# Patient Record
Sex: Male | Born: 2000 | Race: White | Hispanic: No | Marital: Single | Smoking: Never smoker
Health system: Southern US, Community
[De-identification: ages and names within clinical notes are randomized; demographics above are authoritative.]

## PROBLEM LIST (undated history)

## (undated) DIAGNOSIS — F909 Attention-deficit hyperactivity disorder, unspecified type: Secondary | ICD-10-CM

## (undated) HISTORY — DX: Attention-deficit hyperactivity disorder, unspecified type: F90.9

## (undated) HISTORY — PX: CIRCUMCISION: SUR203

---
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
## 2020-06-02 ENCOUNTER — Other Ambulatory Visit: Payer: Self-pay | Admitting: *Deleted

## 2020-06-02 ENCOUNTER — Encounter: Payer: Self-pay | Admitting: Family Medicine

## 2020-06-02 ENCOUNTER — Encounter: Payer: Self-pay | Admitting: *Deleted

## 2020-06-07 ENCOUNTER — Telehealth: Payer: Self-pay

## 2020-06-07 NOTE — Telephone Encounter (Signed)
Please advise 

## 2020-06-07 NOTE — Telephone Encounter (Signed)
Pt.'s mother called. He is going to be a new pt in November. He was being seen at [facility] and was promised by his provider that he would be seen until he made the transfer. However, once [facility] sent his records over apparently they considered him not a patient at their practice anymore. This creates a problem for the Pt because he has ADHD and for the month of October will not have any medication. Pt.'s mom totally understands if we can not help with this, but wanted to give it a try to see if anything can be done.

## 2020-06-07 NOTE — Telephone Encounter (Signed)
err

## 2020-06-07 NOTE — Telephone Encounter (Signed)
No[facility] next opening would be around december

## 2020-06-07 NOTE — Telephone Encounter (Signed)
Do we have any opening for new pt in September?

## 2020-06-14 NOTE — Telephone Encounter (Signed)
Patient's mother is calling in asking for an update, states that she was told they would see him until he turned 89 but unfortunately they dismissed him as a patient once they sent records over.

## 2020-06-15 NOTE — Telephone Encounter (Signed)
Please schedule patient for end of September is possible. Thanks

## 2020-06-15 NOTE — Telephone Encounter (Signed)
LVM for patient to call back and r/s appt.  

## 2020-07-05 ENCOUNTER — Encounter: Payer: Self-pay | Admitting: Family Medicine

## 2020-07-05 ENCOUNTER — Ambulatory Visit (INDEPENDENT_AMBULATORY_CARE_PROVIDER_SITE_OTHER): Payer: BC Managed Care – PPO | Admitting: Family Medicine

## 2020-07-05 ENCOUNTER — Other Ambulatory Visit: Payer: Self-pay

## 2020-07-05 VITALS — BP 133/75 | HR 89 | Temp 98.6°F | Ht 64.0 in | Wt 161.6 lb

## 2020-07-05 DIAGNOSIS — J302 Other seasonal allergic rhinitis: Secondary | ICD-10-CM | POA: Insufficient documentation

## 2020-07-05 DIAGNOSIS — F909 Attention-deficit hyperactivity disorder, unspecified type: Secondary | ICD-10-CM | POA: Diagnosis not present

## 2020-07-05 DIAGNOSIS — L709 Acne, unspecified: Secondary | ICD-10-CM | POA: Diagnosis not present

## 2020-07-05 DIAGNOSIS — Z23 Encounter for immunization: Secondary | ICD-10-CM | POA: Diagnosis not present

## 2020-07-05 MED ORDER — LISDEXAMFETAMINE DIMESYLATE 60 MG PO CAPS: 60.0000 mg | ORAL_CAPSULE | ORAL | 0 refills | Status: DC

## 2020-07-05 MED ORDER — METHYLPHENIDATE HCL 10 MG PO TABS: ORAL_TABLET | ORAL | 0 refills | Status: DC

## 2020-07-05 NOTE — Assessment & Plan Note (Signed)
Stable.  Continue Zyrtec as needed seasonally. 

## 2020-07-05 NOTE — Assessment & Plan Note (Signed)
Patient with longstanding history of ADHD.  He has been prescribed several medications in the past.  He is currently on Vyvanse 60 mg in the morning and Ritalin 15 mg if needed in the afternoon.  He usually breaks his afternoon Ritalin into 2 separate doses depending on need.  Database is consistent with reported history.  He has been tolerating medications well without any reported side effects.  Medications help with ability to stay focused and on task.  Controlled substance agreement was signed and scanned into the chart.  Will refill today.  Follow-up in 55-months.

## 2020-07-05 NOTE — Patient Instructions (Signed)
It was very nice to see you today!  I will refill your medications today.  I will see you back in 3 months.  Please come back to see me sooner if needed.  Take care, Dr Jimmey Ralph  Please try these tips to maintain a healthy lifestyle:   Eat at least 3 REAL meals and 1-2 snacks per day.  Aim for no more than 5 hours between eating.  If you eat breakfast, please do so within one hour of getting up.    Each meal should contain half fruits/vegetables, one quarter protein, and one quarter carbs (no bigger than a computer mouse)   Cut down on sweet beverages. This includes juice, soda, and sweet tea.     Drink at least 1 glass of water with each meal and aim for at least 8 glasses per day   Exercise at least 150 minutes every week.

## 2020-07-05 NOTE — Assessment & Plan Note (Signed)
Stable.  Continue benzoyl peroxide.

## 2020-07-05 NOTE — Progress Notes (Signed)
   Steve Maynard is a 19 y.o. male who presents today for an office visit.  He is a new patient.   Assessment/Plan:  Chronic Problems Addressed Today: Acne Stable.  Continue benzoyl peroxide.  Seasonal allergies Stable.  Continue Zyrtec as needed seasonally.  ADHD Patient with longstanding history of ADHD.  He has been prescribed several medications in the past.  He is currently on Vyvanse 60 mg in the morning and Ritalin 15 mg if needed in the afternoon.  He usually breaks his afternoon Ritalin into 2 separate doses depending on need.  Database is consistent with reported history.  He has been tolerating medications well without any reported side effects.  Medications help with ability to stay focused and on task.  Controlled substance agreement was signed and scanned into the chart.  Will refill today.  Follow-up in 7-months.  Flu vaccine given today.     Subjective:  HPI:  See A/p for status of chronic conditions.  He is here today as a new patient to establish care.  He requests refills on ADHD medications.       Objective:  Physical Exam: BP 133/75   Pulse 89   Temp 98.6 F (37 C)   Ht 5\' 4"  (1.626 m)   Wt 161 lb 9.6 oz (73.3 kg)   SpO2 98%   BMI 27.74 kg/m   Gen: No acute distress, resting comfortably CV: Regular rate and rhythm with no murmurs appreciated Pulm: Normal work of breathing, clear to auscultation bilaterally with no crackles, wheezes, or rhonchi Neuro: Grossly normal, moves all extremities Psych: Normal affect and thought content      Orissa Arreaga M. , MD 07/05/2020 4:16 PM

## 2020-08-01 ENCOUNTER — Ambulatory Visit: Payer: BC Managed Care – PPO | Admitting: Family Medicine

## 2020-08-09 ENCOUNTER — Ambulatory Visit: Payer: BC Managed Care – PPO | Admitting: Family Medicine

## 2020-10-05 ENCOUNTER — Other Ambulatory Visit: Payer: Self-pay

## 2020-10-05 ENCOUNTER — Encounter: Payer: Self-pay | Admitting: Family Medicine

## 2020-10-05 ENCOUNTER — Ambulatory Visit (INDEPENDENT_AMBULATORY_CARE_PROVIDER_SITE_OTHER): Payer: BC Managed Care – PPO | Admitting: Family Medicine

## 2020-10-05 VITALS — BP 122/77 | HR 99 | Temp 98.7°F | Ht 64.0 in | Wt 164.0 lb

## 2020-10-05 DIAGNOSIS — F909 Attention-deficit hyperactivity disorder, unspecified type: Secondary | ICD-10-CM

## 2020-10-05 DIAGNOSIS — J302 Other seasonal allergic rhinitis: Secondary | ICD-10-CM

## 2020-10-05 MED ORDER — LISDEXAMFETAMINE DIMESYLATE 60 MG PO CAPS
60.0000 mg | ORAL_CAPSULE | ORAL | 0 refills | Status: DC
Start: 2020-12-04 — End: 2020-12-19

## 2020-10-05 MED ORDER — LISDEXAMFETAMINE DIMESYLATE 60 MG PO CAPS
60.0000 mg | ORAL_CAPSULE | ORAL | 0 refills | Status: DC
Start: 2020-11-04 — End: 2020-12-19

## 2020-10-05 MED ORDER — METHYLPHENIDATE HCL 10 MG PO TABS
ORAL_TABLET | ORAL | 0 refills | Status: DC
Start: 2020-12-04 — End: 2020-12-19

## 2020-10-05 MED ORDER — METHYLPHENIDATE HCL 10 MG PO TABS
ORAL_TABLET | ORAL | 0 refills | Status: DC
Start: 2020-10-05 — End: 2020-12-19

## 2020-10-05 MED ORDER — METHYLPHENIDATE HCL 10 MG PO TABS
ORAL_TABLET | ORAL | 0 refills | Status: DC
Start: 2020-11-04 — End: 2020-12-19

## 2020-10-05 MED ORDER — LISDEXAMFETAMINE DIMESYLATE 60 MG PO CAPS
60.0000 mg | ORAL_CAPSULE | ORAL | 0 refills | Status: DC
Start: 2020-10-05 — End: 2020-12-19

## 2020-10-05 NOTE — Assessment & Plan Note (Signed)
Tolerating Vyvanse 60 mg daily and Ritalin 15 mg in the afternoon well.  No reported side effects.  Database without red flags.  Medication helps with ability to stay focused and on task.  Will refill today.  Follow-up in 6 months.

## 2020-10-05 NOTE — Assessment & Plan Note (Signed)
Stable.  Continue Zyrtec as needed seasonally. 

## 2020-10-05 NOTE — Progress Notes (Signed)
   Terin Cragle is a 19 y.o. male who presents today for an office visit.  Assessment/Plan:  Chronic Problems Addressed Today: Seasonal allergies Stable.  Continue Zyrtec as needed seasonally.  ADHD Tolerating Vyvanse 60 mg daily and Ritalin 15 mg in the afternoon well.  No reported side effects.  Database without red flags.  Medication helps with ability to stay focused and on task.  Will refill today.  Follow-up in 6 months.     Subjective:  HPI:  See A/p.         Objective:  Physical Exam: BP 122/77   Pulse 99   Temp 98.7 F (37.1 C) (Temporal)   Ht 5\' 4"  (1.626 m)   Wt 164 lb (74.4 kg)   SpO2 98%   BMI 28.15 kg/m   Gen: No acute distress, resting comfortably CV: Regular rate and rhythm with no murmurs appreciated Pulm: Normal work of breathing, clear to auscultation bilaterally with no crackles, wheezes, or rhonchi Neuro: Grossly normal, moves all extremities Psych: Normal affect and thought content      Renessa Wellnitz M. , MD 10/05/2020 10:33 AM

## 2020-10-05 NOTE — Patient Instructions (Signed)
It was very nice to see you today!  I am glad that you are doing well.  I will refill your medications today.  I will see you back in 6 months.  Please let me know when you need refill in 3 months.  Take care, Dr Jimmey Ralph  Please try these tips to maintain a healthy lifestyle:   Eat at least 3 REAL meals and 1-2 snacks per day.  Aim for no more than 5 hours between eating.  If you eat breakfast, please do so within one hour of getting up.    Each meal should contain half fruits/vegetables, one quarter protein, and one quarter carbs (no bigger than a computer mouse)   Cut down on sweet beverages. This includes juice, soda, and sweet tea.     Drink at least 1 glass of water with each meal and aim for at least 8 glasses per day   Exercise at least 150 minutes every week.

## 2020-11-17 ENCOUNTER — Encounter: Payer: Self-pay | Admitting: Family Medicine

## 2020-11-17 ENCOUNTER — Ambulatory Visit (INDEPENDENT_AMBULATORY_CARE_PROVIDER_SITE_OTHER): Payer: BC Managed Care – PPO | Admitting: Family Medicine

## 2020-11-17 ENCOUNTER — Other Ambulatory Visit: Payer: Self-pay

## 2020-11-17 VITALS — BP 123/81 | HR 102 | Temp 98.7°F | Ht 64.02 in | Wt 168.6 lb

## 2020-11-17 DIAGNOSIS — L980 Pyogenic granuloma: Secondary | ICD-10-CM | POA: Diagnosis not present

## 2020-11-17 NOTE — Patient Instructions (Signed)
It was very nice to see you today!  You have a pyogenic granuloma. This is benign. We cauterized the area today. Please let me know if it comes back in the next week or so when we can cauterize again or we could fully excise it.  Take care, Dr Jimmey Ralph  Please try these tips to maintain a healthy lifestyle:   Eat at least 3 REAL meals and 1-2 snacks per day.  Aim for no more than 5 hours between eating.  If you eat breakfast, please do so within one hour of getting up.    Each meal should contain half fruits/vegetables, one quarter protein, and one quarter carbs (no bigger than a computer mouse)   Cut down on sweet beverages. This includes juice, soda, and sweet tea.     Drink at least 1 glass of water with each meal and aim for at least 8 glasses per day   Exercise at least 150 minutes every week.    Pyogenic Granuloma Pyogenic granuloma is a growth (lesion) that forms on the skin. This type of growth is a lump of very red tissue that bleeds easily. A pyogenic granuloma is usually a single lesion that most often affects:  The head and neck.  The mucous membranes of the mouth or tongue.  The upper body.  The hands and feet. A pyogenic granuloma usually measures about 0.5 inch (1.3 cm), but lesions can be smaller or larger. This condition does not spread from person to person (is not contagious). The lesion is benign, which means it is not cancerous. What are the causes? This condition is caused by a reaction of your skin or mucous membranes. The reaction causes a mound of tiny blood vessels (capillaries) to form a lesion. A pyogenic granuloma often happens after a minor injury, such as pricking your skin or biting your lip or tongue. Sometimes it occurs without an injury. The exact cause of the reaction is not known. What increases the risk? You are more likely to develop this condition if:  You are pregnant.  You are a child or young adult.  You take certain medicines.  These especially include: ? Medicines for acne. ? Birth control pills. ? Some medicines used to treat cancer, HIV, or AIDS. What are the signs or symptoms? The main symptom of this condition is a raised or lumpy lesion that is very red. Your lesion may also:  Have a crusty, broken, and irritated (ulcerated) surface.  Bleed easily.  Be slightly sore. How is this diagnosed? This condition is diagnosed based on your symptoms and medical history, especially if you recently had an injury.  You may also have:  A physical exam.  A small piece of your granuloma removed for testing (biopsy) to rule out cancer. How is this treated? A small lesion may go away without treatment. You may have to stop or change any medicines that caused your lesion. Pyogenic granulomas caused by pregnancy usually go away after delivery. This condition may also be treated by removal of your lesion. This may be done if your lesion is large, irritated, or bleeds easily. Removal may involve:  Curettage. This scrapes away the lesion.  Using chemicals or electric energy to destroy the lesion.  Surgical excision. This removes the lesion along with a small piece of normal skin or mucous membrane. This is the best treatment to prevent the lesion from coming back. Follow these instructions at home:  Take over-the-counter and prescription medicines only as told  by your health care provider.  Do not scratch or pick at your lesion. Cover your lesion area with a bandage (dressing) or gauze to avoid having anything rub against your lesion.  Keep your lesion clean to avoid infection.  Keep all follow-up visits as told by your health care provider. This is important. Contact a health care provider if:  You have a fever.  Your lesion bleeds.  Your lesion comes back after treatment. Summary  Pyogenic granuloma is a growth (lesion) that forms on the skin or on the mucous membranes of the mouth. This condition does not  spread from person to person (is not contagious). The lesion is benign, which means it is not cancerous.  A small lesion may go away without treatment. If your lesion is large, irritated, or bleeds easily, you may need to have it removed.  Do not scratch or pick at your lesion. Cover your lesion area with a bandage (dressing) or gauze to avoid having anything rub against your lesion.  Keep your lesion clean to avoid infection. This information is not intended to replace advice given to you by your health care provider. Make sure you discuss any questions you have with your health care provider. Document Revised: 07/30/2019 Document Reviewed: 07/30/2019 Elsevier Patient Education  2021 [employer].

## 2020-11-17 NOTE — Progress Notes (Signed)
   Steve Maynard is a 20 y.o. male who presents today for an office visit.  Assessment/Plan:  Pyogenic Granuloma No red flags. Discussed treatment options with patient today. Elected to proceed with chemical cauterization with silver nitrate in the office today. See below procedure note. He tolerated well. If symptoms return would consider full excision or reattempted cauterization. He will let me know if granuloma does not resolve over the next week or so.    Subjective:  HPI:  Patient here with lesion on left great toe for the last several weeks. He was worried it might be infected. He has been trying to use topical antibiotic ointment with modest improvement. Has some pain to the area. No fevers or chills.       Objective:  Physical Exam: BP 123/81   Pulse (!) 102   Temp 98.7 F (37.1 C) (Temporal)   Ht 5' 4.02" (1.626 m)   Wt 168 lb 9.6 oz (76.5 kg)   SpO2 97%   BMI 28.92 kg/m   Gen: No acute distress, resting comfortably Skin: Approximately 1 x 4 mm pedunculated granuloma on medial nail fold of left great toe. Neuro: Grossly normal, moves all extremities Psych: Normal affect and thought content  Procedure Note: Verbal consent obtained. The above granuloma was cauterized with topical silver nitrate. Minimal pain. He tolerated well. Discussed care after.      Katina Degree. Jimmey Ralph, MD 11/17/2020 1:23 PM

## 2020-12-06 ENCOUNTER — Ambulatory Visit: Payer: BC Managed Care – PPO | Admitting: Family Medicine

## 2020-12-06 ENCOUNTER — Other Ambulatory Visit: Payer: Self-pay

## 2020-12-06 ENCOUNTER — Encounter: Payer: Self-pay | Admitting: Family Medicine

## 2020-12-06 VITALS — BP 134/82 | HR 80 | Temp 98.7°F | Ht 64.0 in | Wt 169.6 lb

## 2020-12-06 DIAGNOSIS — L980 Pyogenic granuloma: Secondary | ICD-10-CM | POA: Diagnosis not present

## 2020-12-06 NOTE — Progress Notes (Signed)
   Steve Maynard is a 20 y.o. male who presents today for an office visit.  Assessment/Plan:  Pyogenic Granuloma No red flags.  Chemical cauterization performed today.  See below procedure note.  He tolerated well.  If symptoms persist would consider 1 more trial of chemical cauterization before attempting full excision.  He can continue using over-the-counter meds as needed for pain.    Subjective:  HPI:  Patient here for pyogenic granuloma follow-up.  Was seen 3 weeks ago.  Had chemical cauterization at that time.  Symptoms have improved significantly though still modestly persistent.  He would like to have repeat chemical cauterization again today.       Objective:  Physical Exam: BP 134/82   Pulse 80   Temp 98.7 F (37.1 C) (Temporal)   Ht 5\' 4"  (1.626 m)   Wt 169 lb 9.6 oz (76.9 kg)   SpO2 99%   BMI 29.11 kg/m   Gen: No acute distress, resting comfortably Skin: Approximately 1 mm x 4 mm pedunculated granuloma on medial nail fold of left great toe. Psych: Normal affect and thought content  Procedure note: Verbal consent was obtained.  Granuloma was cauterized using topical silver nitrate.  Minimal pain.  He tolerated well.  Discussed care after.      . Katina Degree, MD 12/06/2020 1:30 PM

## 2020-12-06 NOTE — Patient Instructions (Signed)
It was very nice to see you today!  We cauterized the spot on your toe today.  Please let us know if it is not improving in the next couple weeks and we can try another round.  Take care, Dr Jimmey Ralph  Please try these tips to maintain a healthy lifestyle:   Eat at least 3 REAL meals and 1-2 snacks per day.  Aim for no more than 5 hours between eating.  If you eat breakfast, please do so within one hour of getting up.    Each meal should contain half fruits/vegetables, one quarter protein, and one quarter carbs (no bigger than a computer mouse)   Cut down on sweet beverages. This includes juice, soda, and sweet tea.     Drink at least 1 glass of water with each meal and aim for at least 8 glasses per day   Exercise at least 150 minutes every week.    Pyogenic Granuloma Pyogenic granuloma is a growth (lesion) that forms on the skin or on the mucous membranes of the mouth. This type of growth is a lump of very red tissue that bleeds easily. A pyogenic granuloma is usually a single lesion that most often affects:  The head and neck.  The mucous membranes of the mouth or tongue.  The upper body.  The hands and feet. A pyogenic granuloma usually measures about 0.5 inch (1.3 cm), but lesions can be smaller or larger. This condition does not spread from person to person (is not contagious). The lesion is benign, which means it is not cancerous. What are the causes? This condition is caused by a reaction of your skin or mucous membranes. The reaction causes a mound of tiny blood vessels (capillaries) to form a lesion. A pyogenic granuloma often happens after a minor injury, such as pricking your skin or biting your lip or tongue. Sometimes it occurs without an injury. The exact cause of the reaction is not known. What increases the risk? You are more likely to develop this condition if:  You are pregnant.  You are a child or young adult.  You take certain medicines. These  especially include: ? Medicines for acne. ? Birth control pills. ? Some medicines used to treat cancer, HIV, or AIDS. What are the signs or symptoms? The main symptom of this condition is a raised or lumpy lesion that is very red. Your lesion may also:  Have a crusty, broken, and irritated (ulcerated) surface.  Bleed easily.  Be slightly sore. How is this diagnosed? This condition is diagnosed based on your symptoms and medical history, especially if you recently had an injury.  You may also have:  A physical exam.  A small piece of your granuloma removed for testing (biopsy) to rule out cancer. How is this treated? A small lesion may go away without treatment. You may have to stop or change any medicines that caused your lesion. Pyogenic granulomas caused by pregnancy usually go away after delivery. This condition may also be treated by removal of your lesion. This may be done if your lesion is large, irritated, or bleeds easily. Removal may involve:  Curettage. This scrapes away the lesion.  Using chemicals or electric energy to destroy the lesion.  Surgical excision. This removes the lesion along with a small piece of normal skin or mucous membrane. This is the best treatment to prevent the lesion from coming back. Follow these instructions at home:  Take over-the-counter and prescription medicines only as told by  your health care provider.  Do not scratch or pick at your lesion. Cover your lesion area with a bandage (dressing) or gauze to avoid having anything rub against your lesion.  Keep your lesion clean to avoid infection.  Keep all follow-up visits as told by your health care provider. This is important. Contact a health care provider if:  You have a fever.  Your lesion bleeds.  Your lesion comes back after treatment. Summary  Pyogenic granuloma is a growth (lesion) that forms on the skin or on the mucous membranes of the mouth. This condition does not spread  from person to person (is not contagious). The lesion is benign, which means it is not cancerous.  A small lesion may go away without treatment. If your lesion is large, irritated, or bleeds easily, you may need to have it removed.  Do not scratch or pick at your lesion. Cover your lesion area with a bandage (dressing) or gauze to avoid having anything rub against your lesion.  Keep your lesion clean to avoid infection. This information is not intended to replace advice given to you by your health care provider. Make sure you discuss any questions you have with your health care provider. Document Revised: 07/30/2019 Document Reviewed: 07/30/2019 Elsevier Patient Education  2021 [employer].

## 2020-12-19 ENCOUNTER — Encounter: Payer: Self-pay | Admitting: Family Medicine

## 2020-12-19 ENCOUNTER — Other Ambulatory Visit: Payer: Self-pay | Admitting: Family Medicine

## 2020-12-20 MED ORDER — METHYLPHENIDATE HCL 10 MG PO TABS: ORAL_TABLET | ORAL | 0 refills | Status: DC

## 2020-12-20 MED ORDER — LISDEXAMFETAMINE DIMESYLATE 60 MG PO CAPS: 60.0000 mg | ORAL_CAPSULE | ORAL | 0 refills | Status: DC

## 2020-12-20 NOTE — Telephone Encounter (Signed)
Rx request 

## 2021-03-21 ENCOUNTER — Encounter: Payer: BC Managed Care – PPO | Admitting: Family Medicine

## 2021-03-31 ENCOUNTER — Ambulatory Visit (INDEPENDENT_AMBULATORY_CARE_PROVIDER_SITE_OTHER): Payer: BC Managed Care – PPO | Admitting: Family Medicine

## 2021-03-31 ENCOUNTER — Other Ambulatory Visit: Payer: Self-pay

## 2021-03-31 ENCOUNTER — Encounter: Payer: Self-pay | Admitting: Family Medicine

## 2021-03-31 DIAGNOSIS — F909 Attention-deficit hyperactivity disorder, unspecified type: Secondary | ICD-10-CM | POA: Diagnosis not present

## 2021-03-31 MED ORDER — METHYLPHENIDATE HCL 10 MG PO TABS: ORAL_TABLET | ORAL | 0 refills | Status: DC

## 2021-03-31 MED ORDER — LISDEXAMFETAMINE DIMESYLATE 70 MG PO CAPS: 70.0000 mg | ORAL_CAPSULE | Freq: Every day | ORAL | 0 refills | Status: DC

## 2021-03-31 MED ORDER — METHYLPHENIDATE HCL 10 MG PO TABS
ORAL_TABLET | ORAL | 0 refills | Status: DC
Start: 2021-04-30 — End: 2021-06-25

## 2021-03-31 NOTE — Progress Notes (Signed)
Chief Complaint:  Steve Maynard is a 20 y.o. male who presents today for his annual comprehensive physical exam.    Assessment/Plan:  Chronic Problems Addressed Today: ADHD Daughter feels like current dose of Vyvanse is effective.  We discussed options between increasing dose of Vyvanse or switching to alternative.  We will increase Vyvanse to 70 mg daily.  We will follow up in 3 months.  If continues to have issues may consider switching to alternative.  Discussed importance of drug holidays.  Also refill Ritalin today.  Tolerating meds well without significant side effects.  Database without red flags.  Medications help with ability to stay focused and on task  Preventative Healthcare: Up-to-date on vaccines and screenings.  Discussed lifestyle modifications.  Patient Counseling(The following topics were reviewed and/or handout was given):  -Nutrition: Stressed importance of moderation in sodium/caffeine intake, saturated fat and cholesterol, caloric balance, sufficient intake of fresh fruits, vegetables, and fiber.  -Stressed the importance of regular exercise.   -Substance Abuse: Discussed cessation/primary prevention of tobacco, alcohol, or other drug use; driving or other dangerous activities under the influence; availability of treatment for abuse.   -Injury prevention: Discussed safety belts, safety helmets, smoke detector, smoking near bedding or upholstery.   -Sexuality: Discussed sexually transmitted diseases, partner selection, use of condoms, avoidance of unintended pregnancy and contraceptive alternatives.   -Dental health: Discussed importance of regular tooth brushing, flossing, and dental visits.  -Health maintenance and immunizations reviewed. Please refer to Health maintenance section.  Return to care in 1 year for next preventative visit.     Subjective:  HPI:  He complains that his ADHD medication Vyvanse is taking more time to take effect, as well as reduced  effectiveness. He had tried other ADHD medication, but it did not produce any effect and has since continued with Vyvanse.    Lifestyle Diet: Has a reasonably healthy diet. Exercise:No stated exercise regimen, but does yardwork.   Depression screen PHQ 2/9 03/31/2021  Decreased Interest 0  Down, Depressed, Hopeless 0  PHQ - 2 Score 0    Health Maintenance Due  Topic Date Due   HPV VACCINES (1 - Male 2-dose series) Never done   HIV Screening  Never done   Hepatitis C Screening  Never done     ROS: Per HPI, otherwise a complete review of systems was negative.   PMH:  The following were reviewed and entered/updated in epic: Past Medical History:  Diagnosis Date   ADHD    Patient Active Problem List   Diagnosis Date Noted   ADHD 07/05/2020   Seasonal allergies 07/05/2020   Acne 07/05/2020   Past Surgical History:  Procedure Laterality Date   CIRCUMCISION      Family History  Adopted: Yes  Problem Relation Age of Onset   Hashimoto's thyroiditis Brother     Medications- reviewed and updated Current Outpatient Medications  Medication Sig Dispense Refill   lisdexamfetamine (VYVANSE) 70 MG capsule Take 1 capsule (70 mg total) by mouth daily. 30 capsule 0   [START ON 05/30/2021] lisdexamfetamine (VYVANSE) 70 MG capsule Take 1 capsule (70 mg total) by mouth daily. 30 capsule 0   [START ON 04/30/2021] lisdexamfetamine (VYVANSE) 70 MG capsule Take 1 capsule (70 mg total) by mouth daily. 30 capsule 0   methylphenidate (RITALIN) 10 MG tablet Take 1 and 1/2 tablet in the afternoon. 45 tablet 0   [START ON 05/30/2021] methylphenidate (RITALIN) 10 MG tablet Take 1 and 1/2 tablets in the after noon 45  tablet 0   [START ON 04/30/2021] methylphenidate (RITALIN) 10 MG tablet Take 1 and 1/2 tablet in the afternoon 45 tablet 0   No current facility-administered medications for this visit.    Allergies-reviewed and updated No Known Allergies  Social History   Socioeconomic History    Marital status: Single    Spouse name: Not on file   Number of children: Not on file   Years of education: Not on file   Highest education level: Not on file  Occupational History   Not on file  Tobacco Use   Smoking status: Never   Smokeless tobacco: Never  Substance and Sexual Activity   Alcohol use: Never    Alcohol/week: 0.0 standard drinks   Drug use: Never   Sexual activity: Not on file  Other Topics Concern   Not on file  Social History Narrative   Lives with parents and brother Steve Maynard.      7th grade- [employer]   Social Determinants of Corporate investment banker Strain: Not on file  Food Insecurity: Not on file  Transportation Needs: Not on file  Physical Activity: Not on file  Stress: Not on file  Social Connections: Not on file        Objective:  Physical Exam: BP 140/86   Pulse 91   Temp 97.6 F (36.4 C) (Temporal)   Wt 164 lb (74.4 kg)   SpO2 98%   BMI 28.15 kg/m   Body mass index is 28.15 kg/m. Wt Readings from Last 3 Encounters:  03/31/21 164 lb (74.4 kg) (64 %, Z= 0.37)*  12/06/20 169 lb 9.6 oz (76.9 kg) (73 %, Z= 0.60)*  11/17/20 168 lb 9.6 oz (76.5 kg) (72 %, Z= 0.58)*   * Growth percentiles are based on CDC (Boys, 2-20 Years) data.   Gen: NAD, resting comfortably HEENT: TMs normal bilaterally. OP clear. No thyromegaly noted.  CV: RRR with no murmurs appreciated Pulm: NWOB, CTAB with no crackles, wheezes, or rhonchi GI: Normal bowel sounds present. Soft, Nontender, Nondistended. MSK: no edema, cyanosis, or clubbing noted Skin: warm, dry Neuro: CN2-12 grossly intact. Strength 5/5 in upper and lower extremities. Reflexes symmetric and intact bilaterally.  Psych: Normal affect and thought content     I,Jordan Kelly,acting as a scribe for Jacquiline Doe, MD.,have documented all relevant documentation on the behalf of Jacquiline Doe, MD,as directed by  Jacquiline Doe, MD while in the presence of Jacquiline Doe, MD.   I, Jacquiline Doe,  MD, have reviewed all documentation for this visit. The documentation on 03/31/21 for the exam, diagnosis, procedures, and orders are all accurate and complete.   Katina Degree. Jimmey Ralph, MD 03/31/2021 12:35 PM

## 2021-03-31 NOTE — Patient Instructions (Signed)
It was very nice to see you today!  We will increase your Vyvanse to 70 mg daily.  Continue current dose of Ritalin.  I will see you back in 3 months for med check.  Please come back to see me sooner if needed.  Take care, Dr Jimmey Ralph  PLEASE NOTE:  If you had any lab tests please let us know if you have not heard back within a few days. You may see your results on mychart before we have a chance to review them but we will give you a call once they are reviewed by [place]. If we ordered any referrals today, please let us know if you have not heard from their office within the next week.   Please try these tips to maintain a healthy lifestyle:  Eat at least 3 REAL meals and 1-2 snacks per day.  Aim for no more than 5 hours between eating.  If you eat breakfast, please do so within one hour of getting up.   Each meal should contain half fruits/vegetables, one quarter protein, and one quarter carbs (no bigger than a computer mouse)  Cut down on sweet beverages. This includes juice, soda, and sweet tea.   Drink at least 1 glass of water with each meal and aim for at least 8 glasses per day  Exercise at least 150 minutes every week.   Preventive Care 17-74 Years Old, Male Preventive care refers to lifestyle choices and visits with your health care provider that can promote health and wellness. At this stage in your life, you may start seeing a primary care physician instead of a pediatrician. It is important to take responsibility for your health and well-being. Preventive care for young adults includes: A yearly physical exam. This is also called an annual wellness visit. Regular dental and eye exams. Immunizations. Screening for certain conditions. Healthy lifestyle choices, such as: Eating a healthy diet. Getting regular exercise. Not using drugs or products that contain nicotine and tobacco. Limiting alcohol use. What can I expect for my preventive care visit? Physical exam Your health  care provider may check your: Height and weight. These may be used to calculate your BMI (body mass index). BMI is a measurement that tells if you are at a healthy weight. Heart rate and blood pressure. Body temperature. Skin for abnormal spots. Counseling Your health care provider may ask you questions about your: Past medical problems. Family's medical history. Alcohol, tobacco, and drug use. Home life and relationship well-being. Access to firearms. Emotional well-being. Diet, exercise, and sleep habits. Sexual activity and sexual health. What immunizations do I need?  Vaccines are usually given at various ages, according to a schedule. Your health care provider will recommend vaccines for you based on your age, medicalhistory, and lifestyle or other factors, such as travel or where you work. What tests do I need? Blood tests Lipid and cholesterol levels. These may be checked every 5 years starting at age 77. Hepatitis C test. Hepatitis B test. Screening Genital exam to check for testicular cancer or hernias. STD (sexually transmitted disease) testing, if you are at risk. Other tests Tuberculosis skin test. Vision and hearing tests. Skin exam. Talk with your health care provider about your test results, treatment options,and if necessary, the need for more tests. Follow these instructions at home: Eating and drinking  Eat a healthy diet that includes fresh fruits and vegetables, whole grains, lean protein, and low-fat dairy products. Drink enough fluid to keep your urine pale yellow.  Do not drink alcohol if: Your health care provider tells you not to drink. You are under the legal drinking age. In the [place] the legal drinking age is 56. If you drink alcohol: Limit how much you use to 0-2 drinks a day. Be aware of how much alcohol is in your drink. In the [place] one drink equals one 12 oz bottle of beer (355 mL), one 5 oz glass of wine (148 mL), or one 1 oz glass of hard  liquor (44 mL).  Lifestyle Take daily care of your teeth and gums. Brush your teeth every morning and night with fluoride toothpaste. Floss one time each day. Stay active. Exercise for at least 30 minutes 5 or more days of the week. Do not use any products that contain nicotine or tobacco, such as cigarettes, e-cigarettes, and chewing tobacco. If you need help quitting, ask your health care provider. Do not use drugs. If you are sexually active, practice safe sex. Use a condom or other form of protection to prevent STIs (sexually transmitted infections). Find healthy ways to cope with stress, such as: Meditation, yoga, or listening to music. Journaling. Talking to a trusted person. Spending time with friends and family. Safety Always wear your seat belt while driving or riding in a vehicle. Do not drive: If you have been drinking alcohol. Do not ride with someone who has been drinking. When you are tired or distracted. While texting. Wear a helmet and other protective equipment during sports activities. If you have firearms in your house, make sure you follow all gun safety procedures. Seek help if you have been bullied, physically abused, or sexually abused. Use the Internet responsibly to avoid dangers, such as online bullying and online sex predators. What's next? Go to your health care provider once a year for an annual wellness visit. Ask your health care provider how often you should have your eyes and teeth checked. Stay up to date on all vaccines. This information is not intended to replace advice given to you by your health care provider. Make sure you discuss any questions you have with your healthcare provider. Document Revised: 06/10/2019 Document Reviewed: 09/18/2018 Elsevier Patient Education  2022 [employer].

## 2021-03-31 NOTE — Assessment & Plan Note (Signed)
Daughter feels like current dose of Vyvanse is effective.  We discussed options between increasing dose of Vyvanse or switching to alternative.  We will increase Vyvanse to 70 mg daily.  We will follow up in 3 months.  If continues to have issues may consider switching to alternative.  Discussed importance of drug holidays.  Also refill Ritalin today.  Tolerating meds well without significant side effects.  Database without red flags.  Medications help with ability to stay focused and on task

## 2021-06-25 ENCOUNTER — Other Ambulatory Visit: Payer: Self-pay | Admitting: Family Medicine

## 2021-06-26 MED ORDER — LISDEXAMFETAMINE DIMESYLATE 70 MG PO CAPS: 70.0000 mg | ORAL_CAPSULE | Freq: Every day | ORAL | 0 refills | Status: DC

## 2021-06-26 MED ORDER — METHYLPHENIDATE HCL 10 MG PO TABS: ORAL_TABLET | ORAL | 0 refills | Status: DC

## 2021-06-28 ENCOUNTER — Other Ambulatory Visit: Payer: Self-pay

## 2021-06-28 NOTE — Telephone Encounter (Signed)
Pt's mom called stating that Dr Jimmey Ralph sent in the refill for Vyvanse to [facility]. Mom stated that [facility] does not have Vyvanse at the moment and needs to be called into another pharmacy. Mom would like Vyvanse to be called into [facility] at [place]. Please Advise.

## 2021-06-29 NOTE — Telephone Encounter (Signed)
Patient's mother is calling in stating [name] has one pill left, wondering if another provider can send this in?

## 2021-06-30 MED ORDER — LISDEXAMFETAMINE DIMESYLATE 70 MG PO CAPS: 70.0000 mg | ORAL_CAPSULE | Freq: Every day | ORAL | 0 refills | Status: DC

## 2021-06-30 NOTE — Telephone Encounter (Signed)
Mom called in stating patient has taken last pill this morning.  States that [facility] is out of Vyvanse and will not have any in stock until October.  [facility] [place] this medication in stock.    Please call mom back today at [phone] with a status update.  States she can try to patch through the weekend with adderall but would prefer to pick up the current script.

## 2021-08-27 ENCOUNTER — Other Ambulatory Visit: Payer: Self-pay | Admitting: Family Medicine

## 2021-08-28 MED ORDER — LISDEXAMFETAMINE DIMESYLATE 70 MG PO CAPS: 70.0000 mg | ORAL_CAPSULE | Freq: Every day | ORAL | 0 refills | Status: DC

## 2021-09-25 ENCOUNTER — Other Ambulatory Visit: Payer: Self-pay | Admitting: Family Medicine

## 2021-09-25 NOTE — Telephone Encounter (Signed)
He needs follow up visit for further refills.

## 2021-09-25 NOTE — Telephone Encounter (Signed)
Please call pt and schedule follow up per Dr. Jimmey Ralph.

## 2021-09-25 NOTE — Telephone Encounter (Signed)
Please schedule app with pt for refills. Pt missed September app.  Thanks!

## 2021-09-26 NOTE — Telephone Encounter (Signed)
Patient has been scheduled

## 2021-09-26 NOTE — Telephone Encounter (Signed)
See below

## 2021-09-27 NOTE — Telephone Encounter (Signed)
Pt scheduled for 10/11/2021 with Dr. Jimmey Ralph.

## 2021-10-11 ENCOUNTER — Ambulatory Visit: Payer: BC Managed Care – PPO | Admitting: Family Medicine

## 2021-10-11 ENCOUNTER — Other Ambulatory Visit: Payer: Self-pay

## 2021-10-11 ENCOUNTER — Encounter: Payer: Self-pay | Admitting: Family Medicine

## 2021-10-11 VITALS — BP 150/100 | HR 111 | Temp 98.6°F | Ht 65.0 in | Wt 151.2 lb

## 2021-10-11 DIAGNOSIS — F909 Attention-deficit hyperactivity disorder, unspecified type: Secondary | ICD-10-CM

## 2021-10-11 DIAGNOSIS — J302 Other seasonal allergic rhinitis: Secondary | ICD-10-CM | POA: Diagnosis not present

## 2021-10-11 MED ORDER — LISDEXAMFETAMINE DIMESYLATE 70 MG PO CAPS
70.0000 mg | ORAL_CAPSULE | Freq: Every day | ORAL | 0 refills | Status: DC
Start: 2021-12-24 — End: 2022-05-29

## 2021-10-11 MED ORDER — LISDEXAMFETAMINE DIMESYLATE 70 MG PO CAPS
ORAL_CAPSULE | ORAL | 0 refills | Status: DC
Start: 2021-10-25 — End: 2022-01-25

## 2021-10-11 MED ORDER — METHYLPHENIDATE HCL 10 MG PO TABS: ORAL_TABLET | ORAL | 0 refills | Status: DC

## 2021-10-11 MED ORDER — LISDEXAMFETAMINE DIMESYLATE 70 MG PO CAPS: 70.0000 mg | ORAL_CAPSULE | Freq: Every day | ORAL | 0 refills | Status: DC

## 2021-10-11 NOTE — Assessment & Plan Note (Signed)
Database without red flags.  We will continue Vyvanse 70 mg daily and continue Ritalin 10 to 15 mg in the evening as needed.  He does have a little bit of tachycardia today but patient states he has anxiety coming in for his doctors appointment.  His heart rate and blood pressure are typically well controlled at home.  We will continue home monitoring and he will let me know if persistently elevated.  Database without red flags today.  Medications help with ability stay focused and on task.  Follow-up in 3 to 6 months.

## 2021-10-11 NOTE — Assessment & Plan Note (Signed)
Stable on Zyrtec as needed seasonally. 

## 2021-10-11 NOTE — Progress Notes (Signed)
° °  Steve Maynard is a 21 y.o. male who presents today for an office visit.  Assessment/Plan:  New/Acute Problems: Elevated BP Reading Initially elevated on exam today with mild tachycardia.  Has typically been well controlled.  Patient has anxiety about coming into the doctor's office.  No chest pain or shortness of breath or any other symptoms.  He will continue to monitor at home and let me know if it is persistently elevated outside of the doctor's office.  We will follow-up again in 3 to 6 months.  Discussed reasons to return to care earlier.  Chronic Problems Addressed Today: ADHD Database without red flags.  We will continue Vyvanse 70 mg daily and continue Ritalin 10 to 15 mg in the evening as needed.  He does have a little bit of tachycardia today but patient states he has anxiety coming in for his doctors appointment.  His heart rate and blood pressure are typically well controlled at home.  We will continue home monitoring and he will let me know if persistently elevated.  Database without red flags today.  Medications help with ability stay focused and on task.  Follow-up in 3 to 6 months.  Seasonal allergies Stable on Zyrtec as needed seasonally.     Subjective:  HPI:  See A/P for status of chronic conditions.  He is here for ADHD follow-up.  On Vyvanse 70 mg daily and Ritalin 10 to 15 mg in the afternoon as needed.  Doing well with this regimen.  No notable side effects.  Does note that it takes a little longer for the Vyvanse to start working in the morning but medications help with managing his symptoms and ability to stay focused and on task.  He is finishing up his college courses and will be graduating later this spring.         Objective:  Physical Exam: BP (!) 150/100    Pulse (!) 111    Temp 98.6 F (37 C) (Temporal)    Ht 5\' 5"  (1.651 m)    Wt 151 lb 3.2 oz (68.6 kg)    SpO2 98%    BMI 25.16 kg/m   Gen: No acute distress, resting comfortably CV: Regular  rate and rhythm with no murmurs appreciated Pulm: Normal work of breathing, clear to auscultation bilaterally with no crackles, wheezes, or rhonchi Neuro: Grossly normal, moves all extremities Psych: Normal affect and thought content      Mar Zettler M. , MD 10/11/2021 11:26 AM

## 2021-10-11 NOTE — Patient Instructions (Signed)
It was very nice to see you today!  I will refill your medications today.  Please keep an eye on your blood pressure at home and let me know if it is persistently elevated.  I will see back in 6 months.  Come back sooner if needed.  Take care, Dr Jerline Pain  PLEASE NOTE:  If you had any lab tests please let us know if you have not heard back within a few days. You may see your results on mychart before we have a chance to review them but we will give you a call once they are reviewed by [place]. If we ordered any referrals today, please let us know if you have not heard from their office within the next week.   Please try these tips to maintain a healthy lifestyle:  Eat at least 3 REAL meals and 1-2 snacks per day.  Aim for no more than 5 hours between eating.  If you eat breakfast, please do so within one hour of getting up.   Each meal should contain half fruits/vegetables, one quarter protein, and one quarter carbs (no bigger than a computer mouse)  Cut down on sweet beverages. This includes juice, soda, and sweet tea.   Drink at least 1 glass of water with each meal and aim for at least 8 glasses per day  Exercise at least 150 minutes every week.

## 2021-11-26 ENCOUNTER — Other Ambulatory Visit: Payer: Self-pay | Admitting: Family Medicine

## 2022-01-25 ENCOUNTER — Other Ambulatory Visit: Payer: Self-pay | Admitting: Family Medicine

## 2022-02-24 ENCOUNTER — Other Ambulatory Visit: Payer: Self-pay | Admitting: Family Medicine

## 2022-02-26 MED ORDER — METHYLPHENIDATE HCL 10 MG PO TABS: ORAL_TABLET | ORAL | 0 refills | Status: DC

## 2022-02-26 MED ORDER — LISDEXAMFETAMINE DIMESYLATE 70 MG PO CAPS: ORAL_CAPSULE | ORAL | 0 refills | Status: DC

## 2022-02-26 NOTE — Telephone Encounter (Signed)
Dr. Jimmey Ralph, please refuse orders, duplicate.

## 2022-03-26 ENCOUNTER — Other Ambulatory Visit: Payer: Self-pay | Admitting: Family Medicine

## 2022-03-26 MED ORDER — METHYLPHENIDATE HCL 10 MG PO TABS: ORAL_TABLET | ORAL | 0 refills | Status: DC

## 2022-03-26 MED ORDER — LISDEXAMFETAMINE DIMESYLATE 70 MG PO CAPS: ORAL_CAPSULE | ORAL | 0 refills | Status: DC

## 2022-03-26 NOTE — Telephone Encounter (Signed)
LAST APPOINTMENT DATE: 10/30/2021   NEXT APPOINTMENT DATE: Visit date not found    LAST REFILL: 02/27/2022

## 2022-04-29 ENCOUNTER — Telehealth: Payer: Self-pay | Admitting: Physician Assistant

## 2022-04-30 ENCOUNTER — Other Ambulatory Visit: Payer: Self-pay | Admitting: Family Medicine

## 2022-04-30 MED ORDER — METHYLPHENIDATE HCL 10 MG PO TABS: ORAL_TABLET | ORAL | 0 refills | Status: DC

## 2022-04-30 MED ORDER — LISDEXAMFETAMINE DIMESYLATE 70 MG PO CAPS: ORAL_CAPSULE | ORAL | 0 refills | Status: DC

## 2022-04-30 MED ORDER — LISDEXAMFETAMINE DIMESYLATE 70 MG PO CAPS
ORAL_CAPSULE | ORAL | 0 refills | Status: DC
Start: 2022-04-30 — End: 2022-05-29

## 2022-04-30 NOTE — Addendum Note (Signed)
Addended by: Ardith Dark on: 04/30/2022 03:40 PM   Modules accepted: Orders

## 2022-04-30 NOTE — Telephone Encounter (Signed)
Lynden Ang from [facility] called to confirm if both the vyvanse 70 mg and ritalin 10 mg should be refilled at the same time or not. States that if they need to be refilled at the same time, she will need clarification on next available date of refill on record. Cathy requests a call back from either the PCP's nurse or the PCP themselves for clarification. She can be reached at [phone], option 3. If she is not available, Aura Camps can be spoken to.

## 2022-04-30 NOTE — Telephone Encounter (Signed)
Pharmacy Redirect   [facility] on [address] have the 17mg  Vyvanse in supply.  Caller request prescription to be redirected to  [facility] on [address]. States they have the 17mg  Vyvanse in supply.   [facility] [address] [place]. [place], [place] [phone]

## 2022-04-30 NOTE — Telephone Encounter (Signed)
Please see note and send Rx to [facility] per pt request

## 2022-04-30 NOTE — Telephone Encounter (Signed)
Needs office visit for refills.

## 2022-04-30 NOTE — Telephone Encounter (Addendum)
Caller states pharmacy has Adderall XR in stock.  Caller states pt took last medication today (07/24), first missed dose will be 0725 morning.  Caller requests a call back if there are any questions or need for OV.

## 2022-05-25 ENCOUNTER — Telehealth: Payer: Self-pay | Admitting: Family Medicine

## 2022-05-25 NOTE — Telephone Encounter (Signed)
SUPPLY ISSUES ** Limited supply** Send ASAP.    LAST APPOINTMENT DATE:   10/11/21 OV with PCP   NEXT APPOINTMENT DATE: 05/29/22 OV with PCP    MEDICATION: lisdexamfetamine (VYVANSE) 70 MG capsule [[id number]]     Is the patient out of medication?  Almost - Will run out next week (08/21)   PHARMACY: [facility] - mail in pharmacy [phone] Fax: [phone]

## 2022-05-28 MED ORDER — LISDEXAMFETAMINE DIMESYLATE 70 MG PO CAPS: 70.0000 mg | ORAL_CAPSULE | Freq: Every day | ORAL | 0 refills | Status: DC

## 2022-05-28 MED ORDER — LISDEXAMFETAMINE DIMESYLATE 70 MG PO CAPS
ORAL_CAPSULE | ORAL | 0 refills | Status: DC
Start: 2022-05-28 — End: 2022-05-29

## 2022-05-28 MED ORDER — CETIRIZINE HCL 10 MG PO TABS: 10.0000 mg | ORAL_TABLET | Freq: Every day | ORAL | 3 refills | Status: DC

## 2022-05-29 ENCOUNTER — Ambulatory Visit: Payer: BC Managed Care – PPO | Admitting: Family Medicine

## 2022-05-29 ENCOUNTER — Encounter: Payer: Self-pay | Admitting: Family Medicine

## 2022-05-29 VITALS — BP 128/74 | HR 94 | Temp 98.4°F | Ht 65.0 in | Wt 147.4 lb

## 2022-05-29 DIAGNOSIS — J302 Other seasonal allergic rhinitis: Secondary | ICD-10-CM

## 2022-05-29 DIAGNOSIS — F909 Attention-deficit hyperactivity disorder, unspecified type: Secondary | ICD-10-CM | POA: Diagnosis not present

## 2022-05-29 MED ORDER — LISDEXAMFETAMINE DIMESYLATE 50 MG PO CAPS: 50.0000 mg | ORAL_CAPSULE | Freq: Every day | ORAL | 0 refills | Status: DC

## 2022-05-29 MED ORDER — LISDEXAMFETAMINE DIMESYLATE 20 MG PO CAPS: 20.0000 mg | ORAL_CAPSULE | Freq: Every day | ORAL | 0 refills | Status: DC

## 2022-05-29 MED ORDER — METHYLPHENIDATE HCL 10 MG PO TABS: 20.0000 mg | ORAL_TABLET | Freq: Every day | ORAL | 0 refills | Status: DC | PRN

## 2022-05-29 NOTE — Assessment & Plan Note (Signed)
Stable on Zyrtec as needed seasonally. 

## 2022-05-29 NOTE — Patient Instructions (Signed)
It was very nice to see you today!  We will send in Vyvanse as a 20 mg and 50 mg tablet.  I will refill the Ritalin.  Please send a message in a few weeks Doristine Mango how this is working.  See back in 3 to 6 months.  Come back sooner if needed.  Take care, Dr Jimmey Ralph  PLEASE NOTE:  If you had any lab tests please let us know if you have not heard back within a few days. You may see your results on mychart before we have a chance to review them but we will give you a call once they are reviewed by [place]. If we ordered any referrals today, please let us know if you have not heard from their office within the next week.   Please try these tips to maintain a healthy lifestyle:  Eat at least 3 REAL meals and 1-2 snacks per day.  Aim for no more than 5 hours between eating.  If you eat breakfast, please do so within one hour of getting up.   Each meal should contain half fruits/vegetables, one quarter protein, and one quarter carbs (no bigger than a computer mouse)  Cut down on sweet beverages. This includes juice, soda, and sweet tea.   Drink at least 1 glass of water with each meal and aim for at least 8 glasses per day  Exercise at least 150 minutes every week.

## 2022-05-29 NOTE — Assessment & Plan Note (Signed)
Database no red flags.  He has been on Vyvanse 70 mg daily and Ritalin 10 mg in the afternoon as needed.  They have recently had issues with finding the 70 mg dose of Vyvanse.  We discussed alternatives.  We will send in as a split 20 mg and 50 mg dose that he can take together.  Hopefully will not have any supply issues with this.  If we continue to have issues with finding Vyvanse would consider switching back to Adderall XR.  Medications help with ability stay focused and on task.  We will follow-up again in 3 to 6 months.  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

## 2022-05-29 NOTE — Progress Notes (Signed)
   Steve Maynard is a 21 y.o. male who presents today for an office visit.  Assessment/Plan:  Chronic Problems Addressed Today: ADHD Database no red flags.  He has been on Vyvanse 70 mg daily and Ritalin 10 mg in the afternoon as needed.  They have recently had issues with finding the 70 mg dose of Vyvanse.  We discussed alternatives.  We will send in as a split 20 mg and 50 mg dose that he can take together.  Hopefully will not have any supply issues with this.  If we continue to have issues with finding Vyvanse would consider switching back to Adderall XR.  Medications help with ability stay focused and on task.  We will follow-up again in 3 to 6 months.  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  Seasonal allergies Stable on Zyrtec as needed seasonally.     Subjective:  HPI:  See A/p for status of chronic conditions.        Objective:  Physical Exam: BP 128/74   Pulse 94   Temp 98.4 F (36.9 C) (Temporal)   Ht 5\' 5"  (1.651 m)   Wt 147 lb 6.4 oz (66.9 kg)   SpO2 98%   BMI 24.53 kg/m   Gen: No acute distress, resting comfortably CV: Regular rate and rhythm with no murmurs appreciated Pulm: Normal work of breathing, clear to auscultation bilaterally with no crackles, wheezes, or rhonchi Neuro: Grossly normal, moves all extremities Psych: Normal affect and thought content      Steve Maynard M. , MD 05/29/2022 9:50 AM

## 2022-05-30 ENCOUNTER — Telehealth: Payer: Self-pay | Admitting: Family Medicine

## 2022-05-30 NOTE — Telephone Encounter (Signed)
Caller States: -Pharmacy does not have supply. -Wanted to speak to PCP team about alternative medicines and prior authorizations that might be needed.   Caller Requests: -Call back for follow up.

## 2022-05-31 NOTE — Telephone Encounter (Signed)
See note

## 2022-06-01 NOTE — Telephone Encounter (Signed)
Can we or thepatient check with the pharmacy to see what they have in stock?  Steve Maynard. Jimmey Ralph, MD 06/01/2022 12:22 PM

## 2022-06-24 ENCOUNTER — Other Ambulatory Visit: Payer: Self-pay | Admitting: Family Medicine

## 2022-06-25 MED ORDER — LISDEXAMFETAMINE DIMESYLATE 20 MG PO CAPS
20.0000 mg | ORAL_CAPSULE | Freq: Every day | ORAL | 0 refills | Status: DC
Start: 2022-06-25 — End: 2022-07-31

## 2022-06-25 MED ORDER — METHYLPHENIDATE HCL 10 MG PO TABS: 20.0000 mg | ORAL_TABLET | Freq: Every day | ORAL | 0 refills | Status: DC | PRN

## 2022-06-25 MED ORDER — LISDEXAMFETAMINE DIMESYLATE 50 MG PO CAPS: 50.0000 mg | ORAL_CAPSULE | Freq: Every day | ORAL | 0 refills | Status: DC

## 2022-07-28 ENCOUNTER — Encounter: Payer: Self-pay | Admitting: Family Medicine

## 2022-07-31 MED ORDER — LISDEXAMFETAMINE DIMESYLATE 70 MG PO CAPS: 70.0000 mg | ORAL_CAPSULE | Freq: Every day | ORAL | 0 refills | Status: DC

## 2022-08-28 ENCOUNTER — Other Ambulatory Visit: Payer: Self-pay | Admitting: Family Medicine

## 2022-08-29 MED ORDER — LISDEXAMFETAMINE DIMESYLATE 70 MG PO CAPS: 70.0000 mg | ORAL_CAPSULE | Freq: Every day | ORAL | 0 refills | Status: DC

## 2022-08-29 MED ORDER — METHYLPHENIDATE HCL 10 MG PO TABS: 20.0000 mg | ORAL_TABLET | Freq: Every day | ORAL | 0 refills | Status: DC | PRN

## 2022-08-29 NOTE — Telephone Encounter (Signed)
Last OV: 05/29/22  Next OV: none scheduled  Last filled: Ritalin 10 mg 06/25/22 45 tablets    Vyvanse 70mg  07/31/22 30 capsules

## 2022-09-26 ENCOUNTER — Other Ambulatory Visit: Payer: Self-pay | Admitting: Family Medicine

## 2022-09-27 MED ORDER — LISDEXAMFETAMINE DIMESYLATE 70 MG PO CAPS: 70.0000 mg | ORAL_CAPSULE | Freq: Every day | ORAL | 0 refills | Status: DC

## 2022-09-27 MED ORDER — METHYLPHENIDATE HCL 10 MG PO TABS: 20.0000 mg | ORAL_TABLET | Freq: Every day | ORAL | 0 refills | Status: DC | PRN

## 2022-10-26 ENCOUNTER — Other Ambulatory Visit: Payer: Self-pay | Admitting: Family Medicine

## 2022-10-26 MED ORDER — LISDEXAMFETAMINE DIMESYLATE 70 MG PO CAPS: 70.0000 mg | ORAL_CAPSULE | Freq: Every day | ORAL | 0 refills | Status: DC

## 2022-10-26 MED ORDER — METHYLPHENIDATE HCL 10 MG PO TABS: 20.0000 mg | ORAL_TABLET | Freq: Every day | ORAL | 0 refills | Status: DC | PRN

## 2022-11-28 ENCOUNTER — Other Ambulatory Visit (HOSPITAL_COMMUNITY): Payer: Self-pay

## 2022-11-28 ENCOUNTER — Other Ambulatory Visit: Payer: Self-pay | Admitting: Family Medicine

## 2022-11-28 MED ORDER — METHYLPHENIDATE HCL 10 MG PO TABS: 20.0000 mg | ORAL_TABLET | Freq: Every day | ORAL | 0 refills | Status: DC | PRN

## 2022-11-28 MED ORDER — LISDEXAMFETAMINE DIMESYLATE 70 MG PO CAPS: 70.0000 mg | ORAL_CAPSULE | Freq: Every day | ORAL | 0 refills | Status: DC

## 2022-11-28 NOTE — Telephone Encounter (Signed)
  Encourage patient to contact the pharmacy for refills or they can request refills through [place]:  Please schedule appointment if longer than 1 year  NEXT APPOINTMENT DATE:  MEDICATION:  lisdexamfetamine (VYVANSE) 70 MG capsule   Is the patient out of medication? Almost  PHARMACY: [facility]  Let patient know to contact pharmacy at the end of the day to make sure medication is ready.  Please notify patient to allow 48-72 hours to process

## 2022-11-30 ENCOUNTER — Other Ambulatory Visit (HOSPITAL_COMMUNITY): Payer: Self-pay

## 2022-11-30 MED ORDER — CETIRIZINE HCL 10 MG PO TABS
10.0000 mg | ORAL_TABLET | Freq: Every day | ORAL | 3 refills | Status: DC
  Filled 2022-11-30: qty 90, 90d supply, fill #0

## 2022-11-30 NOTE — Telephone Encounter (Signed)
Caller requested medication be sent to [facility] outpatient pharmacy, NOT [facility] since they dont have the medication. Can this be corrected please?

## 2022-12-01 ENCOUNTER — Encounter: Payer: Self-pay | Admitting: Family Medicine

## 2022-12-01 ENCOUNTER — Other Ambulatory Visit (HOSPITAL_COMMUNITY): Payer: Self-pay

## 2022-12-03 ENCOUNTER — Other Ambulatory Visit (HOSPITAL_COMMUNITY): Payer: Self-pay

## 2022-12-03 MED ORDER — LISDEXAMFETAMINE DIMESYLATE 70 MG PO CAPS
70.0000 mg | ORAL_CAPSULE | Freq: Every day | ORAL | 0 refills | Status: DC
  Filled 2022-12-03: qty 30, 30d supply, fill #0

## 2022-12-03 NOTE — Telephone Encounter (Signed)
See note

## 2022-12-26 ENCOUNTER — Other Ambulatory Visit: Payer: Self-pay | Admitting: Family Medicine

## 2022-12-26 ENCOUNTER — Other Ambulatory Visit (HOSPITAL_COMMUNITY): Payer: Self-pay

## 2022-12-27 NOTE — Telephone Encounter (Signed)
Patient scheduled for VV tomorrow with PCP

## 2022-12-27 NOTE — Telephone Encounter (Signed)
Last refill: 11/28/22 #45, 0 Last OV: 05/29/22 dx. ADHD

## 2022-12-27 NOTE — Telephone Encounter (Signed)
Last refill: 12/03/22 #30, 0 Last OV: 05/29/22 dx. ADHD

## 2022-12-28 ENCOUNTER — Encounter: Payer: Self-pay | Admitting: Family Medicine

## 2022-12-28 ENCOUNTER — Telehealth (INDEPENDENT_AMBULATORY_CARE_PROVIDER_SITE_OTHER): Payer: BC Managed Care – PPO | Admitting: Family Medicine

## 2022-12-28 ENCOUNTER — Other Ambulatory Visit (HOSPITAL_COMMUNITY): Payer: Self-pay

## 2022-12-28 ENCOUNTER — Telehealth: Payer: Self-pay | Admitting: Family Medicine

## 2022-12-28 VITALS — Ht 65.0 in | Wt 144.8 lb

## 2022-12-28 DIAGNOSIS — F909 Attention-deficit hyperactivity disorder, unspecified type: Secondary | ICD-10-CM

## 2022-12-28 DIAGNOSIS — J302 Other seasonal allergic rhinitis: Secondary | ICD-10-CM | POA: Diagnosis not present

## 2022-12-28 MED ORDER — METHYLPHENIDATE HCL 10 MG PO TABS
20.0000 mg | ORAL_TABLET | Freq: Every day | ORAL | 0 refills | Status: DC | PRN
  Filled 2022-12-28: qty 45, 22d supply, fill #0

## 2022-12-28 MED ORDER — LISDEXAMFETAMINE DIMESYLATE 70 MG PO CAPS
70.0000 mg | ORAL_CAPSULE | Freq: Every day | ORAL | 0 refills | Status: DC
  Filled 2022-12-28 – 2022-12-31 (×2): qty 30, 30d supply, fill #0

## 2022-12-28 NOTE — Progress Notes (Signed)
   Steve Maynard is a 22 y.o. male who presents today for a virtual office visit.  Assessment/Plan:  Chronic Problems Addressed Today: ADHD Doing well on current regimen.  Medications help with ability to stay focused on task.  He is currently on Vyvanse 70 mg daily and Ritalin 10 to 20 mg in the afternoon as needed.  Will refill today.  No significant side effects.  Database was reviewed without red flags.  He will come back in 6 months for in-person office visit.  Seasonal allergies Stable on OTC meds as needed.     Subjective:  HPI:  See A/p for status of chronic conditions.  Currently on Vyvanse 70 mg daily and Ritalin 10 mg in the afternoon as needed.  Doing well with this combination.  Medications help with ability to stay focused on task.  No significant side effects.  Needs refill today.        Objective/Observations  Physical Exam: Gen: NAD, resting comfortably Pulm: Normal work of breathing Neuro: Grossly normal, moves all extremities Psych: Normal affect and thought content  Virtual Visit via Video   I connected with Stark Jock on 12/28/22 at 11:20 AM EDT by a video enabled telemedicine application and verified that I am speaking with the correct person using two identifiers. The limitations of evaluation and management by telemedicine and the availability of in person appointments were discussed. The patient expressed understanding and agreed to proceed.   Patient location: Home Provider location: [place] participating in the virtual visit: Myself and Patient     Algis Greenhouse. Jerline Pain, MD 12/28/2022 11:59 AM

## 2022-12-28 NOTE — Assessment & Plan Note (Signed)
Stable on OTC meds as needed.

## 2022-12-28 NOTE — Telephone Encounter (Signed)
Patient unable to complete Virtual - rescheduled for 01/08/23.  Needs refill of lisdexamfetamine (VYVANSE) 70 MG capsule until appointment  [place] Phone: [phone]  Fax: [phone]

## 2022-12-28 NOTE — Telephone Encounter (Signed)
Rx refilled send to [facility]  at [facility]

## 2022-12-28 NOTE — Assessment & Plan Note (Signed)
Doing well on current regimen.  Medications help with ability to stay focused on task.  He is currently on Vyvanse 70 mg daily and Ritalin 10 to 20 mg in the afternoon as needed.  Will refill today.  No significant side effects.  Database was reviewed without red flags.  He will come back in 6 months for in-person office visit.

## 2022-12-29 ENCOUNTER — Other Ambulatory Visit (HOSPITAL_COMMUNITY): Payer: Self-pay

## 2022-12-31 ENCOUNTER — Other Ambulatory Visit (HOSPITAL_COMMUNITY): Payer: Self-pay

## 2023-01-08 ENCOUNTER — Ambulatory Visit: Payer: BC Managed Care – PPO | Admitting: Family Medicine

## 2023-01-28 ENCOUNTER — Other Ambulatory Visit: Payer: Self-pay | Admitting: Family Medicine

## 2023-01-28 MED ORDER — LISDEXAMFETAMINE DIMESYLATE 70 MG PO CAPS: 70.0000 mg | ORAL_CAPSULE | Freq: Every day | ORAL | 0 refills | Status: DC

## 2023-02-25 ENCOUNTER — Other Ambulatory Visit: Payer: Self-pay | Admitting: Family Medicine

## 2023-02-25 MED ORDER — METHYLPHENIDATE HCL 10 MG PO TABS: 20.0000 mg | ORAL_TABLET | Freq: Every day | ORAL | 0 refills | Status: DC | PRN

## 2023-02-25 MED ORDER — LISDEXAMFETAMINE DIMESYLATE 70 MG PO CAPS: 70.0000 mg | ORAL_CAPSULE | Freq: Every day | ORAL | 0 refills | Status: DC

## 2023-03-29 ENCOUNTER — Other Ambulatory Visit: Payer: Self-pay | Admitting: Family Medicine

## 2023-04-01 MED ORDER — LISDEXAMFETAMINE DIMESYLATE 70 MG PO CAPS: 70.0000 mg | ORAL_CAPSULE | Freq: Every day | ORAL | 0 refills | Status: DC

## 2023-04-01 MED ORDER — METHYLPHENIDATE HCL 10 MG PO TABS: 20.0000 mg | ORAL_TABLET | Freq: Every day | ORAL | 0 refills | Status: DC | PRN

## 2023-04-29 ENCOUNTER — Other Ambulatory Visit: Payer: Self-pay | Admitting: Family Medicine

## 2023-04-30 MED ORDER — LISDEXAMFETAMINE DIMESYLATE 70 MG PO CAPS: 70.0000 mg | ORAL_CAPSULE | Freq: Every day | ORAL | 0 refills | Status: DC

## 2023-04-30 MED ORDER — METHYLPHENIDATE HCL 10 MG PO TABS: 20.0000 mg | ORAL_TABLET | Freq: Every day | ORAL | 0 refills | Status: DC | PRN

## 2023-04-30 NOTE — Telephone Encounter (Signed)
LOV: 12/28/2022 VV  Last Fill Date: 04/01/2023 (Ritalin)  Qty: 45  Refills: 0  Last fil Date: 04/01/2023 (Vyvanse)  Qty: 30:  Refills: 0

## 2023-05-28 ENCOUNTER — Other Ambulatory Visit: Payer: Self-pay | Admitting: Family Medicine

## 2023-05-28 MED ORDER — METHYLPHENIDATE HCL 10 MG PO TABS: 20.0000 mg | ORAL_TABLET | Freq: Every day | ORAL | 0 refills | Status: DC | PRN

## 2023-05-28 MED ORDER — LISDEXAMFETAMINE DIMESYLATE 70 MG PO CAPS: 70.0000 mg | ORAL_CAPSULE | Freq: Every day | ORAL | 0 refills | Status: DC

## 2023-06-20 ENCOUNTER — Encounter: Payer: Self-pay | Admitting: Family Medicine

## 2023-06-20 ENCOUNTER — Ambulatory Visit: Payer: BC Managed Care – PPO | Admitting: Family Medicine

## 2023-06-20 VITALS — BP 132/76 | HR 99 | Temp 98.6°F | Ht 65.0 in | Wt 143.0 lb

## 2023-06-20 DIAGNOSIS — J302 Other seasonal allergic rhinitis: Secondary | ICD-10-CM

## 2023-06-20 DIAGNOSIS — F909 Attention-deficit hyperactivity disorder, unspecified type: Secondary | ICD-10-CM | POA: Diagnosis not present

## 2023-06-20 DIAGNOSIS — Z23 Encounter for immunization: Secondary | ICD-10-CM | POA: Diagnosis not present

## 2023-06-20 MED ORDER — METHYLPHENIDATE HCL 10 MG PO TABS: 20.0000 mg | ORAL_TABLET | Freq: Every day | ORAL | 0 refills | Status: DC | PRN

## 2023-06-20 MED ORDER — LISDEXAMFETAMINE DIMESYLATE 70 MG PO CAPS: 70.0000 mg | ORAL_CAPSULE | Freq: Every day | ORAL | 0 refills | Status: DC

## 2023-06-20 NOTE — Progress Notes (Signed)
   Steve Maynard is a 22 y.o. male who presents today for an office visit.  Assessment/Plan:  Chronic Problems Addressed Today: ADHD Doing well current regimen Vyvanse 70 mg daily and Ritalin 10 to 10 mg in the afternoon as needed.  Will refill today.  No significant side effects.  Medications help with ability stay focused and on task at work.  Follow-up in 6 months.  Seasonal allergies Stable on Zyrtec 10 mg daily  Flu vaccine given today.    Subjective:  HPI:  See A/P for sensory chronic conditions.  Patient is here today for follow-up.  Last seen 6 months ago.Doing well at that time on Vyvanse 70 mg daily and Ritalin 10 to 20 mg in the afternoon as needed.  Doing well with current regimen.  Medications help with ability stay focused and on task.  No significant side effects.       Objective:  Physical Exam: BP 132/76 (BP Location: Left Arm, Patient Position: Sitting, Cuff Size: Normal)   Pulse 99   Temp 98.6 F (37 C) (Temporal)   Ht 5\' 5"  (1.651 m)   Wt 143 lb (64.9 kg)   SpO2 96%   BMI 23.80 kg/m   Gen: No acute distress, resting comfortably CV: Regular rate and rhythm with no murmurs appreciated Pulm: Normal work of breathing, clear to auscultation bilaterally with no crackles, wheezes, or rhonchi Neuro: Grossly normal, moves all extremities Psych: Normal affect and thought content      Steve Maynard M. Jimmey Ralph, MD 06/20/2023 1:18 PM

## 2023-06-20 NOTE — Assessment & Plan Note (Signed)
Doing well current regimen Vyvanse 70 mg daily and Ritalin 10 to 10 mg in the afternoon as needed.  Will refill today.  No significant side effects.  Medications help with ability stay focused and on task at work.  Follow-up in 6 months.

## 2023-06-20 NOTE — Assessment & Plan Note (Signed)
Stable on Zyrtec 10 mg daily. 

## 2023-07-25 ENCOUNTER — Other Ambulatory Visit: Payer: Self-pay | Admitting: Family Medicine

## 2023-07-27 ENCOUNTER — Other Ambulatory Visit: Payer: Self-pay | Admitting: Family Medicine

## 2023-07-29 MED ORDER — METHYLPHENIDATE HCL 10 MG PO TABS: 20.0000 mg | ORAL_TABLET | Freq: Every day | ORAL | 0 refills | Status: DC | PRN

## 2023-07-29 MED ORDER — LISDEXAMFETAMINE DIMESYLATE 70 MG PO CAPS: 70.0000 mg | ORAL_CAPSULE | Freq: Every day | ORAL | 0 refills | Status: DC

## 2023-08-29 ENCOUNTER — Ambulatory Visit (INDEPENDENT_AMBULATORY_CARE_PROVIDER_SITE_OTHER): Payer: BC Managed Care – PPO | Admitting: Family Medicine

## 2023-08-29 ENCOUNTER — Encounter: Payer: Self-pay | Admitting: Family Medicine

## 2023-08-29 VITALS — BP 122/74 | HR 98 | Temp 98.0°F | Ht 65.0 in | Wt 145.0 lb

## 2023-08-29 DIAGNOSIS — J302 Other seasonal allergic rhinitis: Secondary | ICD-10-CM | POA: Diagnosis not present

## 2023-08-29 DIAGNOSIS — F909 Attention-deficit hyperactivity disorder, unspecified type: Secondary | ICD-10-CM | POA: Diagnosis not present

## 2023-08-29 DIAGNOSIS — Z0001 Encounter for general adult medical examination with abnormal findings: Secondary | ICD-10-CM | POA: Diagnosis not present

## 2023-08-29 MED ORDER — METHYLPHENIDATE HCL 10 MG PO TABS: 20.0000 mg | ORAL_TABLET | Freq: Every day | ORAL | 0 refills | Status: DC | PRN

## 2023-08-29 MED ORDER — LISDEXAMFETAMINE DIMESYLATE 70 MG PO CAPS: 70.0000 mg | ORAL_CAPSULE | Freq: Every day | ORAL | 0 refills | Status: DC

## 2023-08-29 NOTE — Patient Instructions (Signed)
It was very nice to see you today!  We will refill your meds.  Please continue to work on diet and exercise.  We will give your tetanus vaccine next time that you are here.  Return in about 6 months (around 02/26/2024) for Follow Up.   Take care, Dr Jimmey Ralph  PLEASE NOTE:  If you had any lab tests, please let us know if you have not heard back within a few days. You may see your results on mychart before we have a chance to review them but we will give you a call once they are reviewed by [place].   If we ordered any referrals today, please let us know if you have not heard from their office within the next week.   If you had any urgent prescriptions sent in today, please check with the pharmacy within an hour of our visit to make sure the prescription was transmitted appropriately.   Please try these tips to maintain a healthy lifestyle:  Eat at least 3 REAL meals and 1-2 snacks per day.  Aim for no more than 5 hours between eating.  If you eat breakfast, please do so within one hour of getting up.   Each meal should contain half fruits/vegetables, one quarter protein, and one quarter carbs (no bigger than a computer mouse)  Cut down on sweet beverages. This includes juice, soda, and sweet tea.   Drink at least 1 glass of water with each meal and aim for at least 8 glasses per day  Exercise at least 150 minutes every week.    Preventive Care 34-5 Years Old, Male Preventive care refers to lifestyle choices and visits with your health care provider that can promote health and wellness. Preventive care visits are also called wellness exams. What can I expect for my preventive care visit? Counseling During your preventive care visit, your health care provider may ask about your: Medical history, including: Past medical problems. Family medical history. Current health, including: Emotional well-being. Home life and relationship well-being. Sexual activity. Lifestyle,  including: Alcohol, nicotine or tobacco, and drug use. Access to firearms. Diet, exercise, and sleep habits. Safety issues such as seatbelt and bike helmet use. Sunscreen use. Work and work Astronomer. Physical exam Your health care provider may check your: Height and weight. These may be used to calculate your BMI (body mass index). BMI is a measurement that tells if you are at a healthy weight. Waist circumference. This measures the distance around your waistline. This measurement also tells if you are at a healthy weight and may help predict your risk of certain diseases, such as type 2 diabetes and high blood pressure. Heart rate and blood pressure. Body temperature. Skin for abnormal spots. What immunizations do I need?  Vaccines are usually given at various ages, according to a schedule. Your health care provider will recommend vaccines for you based on your age, medical history, and lifestyle or other factors, such as travel or where you work. What tests do I need? Screening Your health care provider may recommend screening tests for certain conditions. This may include: Lipid and cholesterol levels. Diabetes screening. This is done by checking your blood sugar (glucose) after you have not eaten for a while (fasting). Hepatitis B test. Hepatitis C test. HIV (human immunodeficiency virus) test. STI (sexually transmitted infection) testing, if you are at risk. Talk with your health care provider about your test results, treatment options, and if necessary, the need for more tests. Follow these instructions at  home: Eating and drinking  Eat a healthy diet that includes fresh fruits and vegetables, whole grains, lean protein, and low-fat dairy products. Drink enough fluid to keep your urine pale yellow. Take vitamin and mineral supplements as recommended by your health care provider. Do not drink alcohol if your health care provider tells you not to drink. If you drink  alcohol: Limit how much you have to 0-2 drinks a day. Know how much alcohol is in your drink. In the [place] one drink equals one 12 oz bottle of beer (355 mL), one 5 oz glass of wine (148 mL), or one 1 oz glass of hard liquor (44 mL). Lifestyle Brush your teeth every morning and night with fluoride toothpaste. Floss one time each day. Exercise for at least 30 minutes 5 or more days each week. Do not use any products that contain nicotine or tobacco. These products include cigarettes, chewing tobacco, and vaping devices, such as e-cigarettes. If you need help quitting, ask your health care provider. Do not use drugs. If you are sexually active, practice safe sex. Use a condom or other form of protection to prevent STIs. Find healthy ways to manage stress, such as: Meditation, yoga, or listening to music. Journaling. Talking to a trusted person. Spending time with friends and family. Minimize exposure to UV radiation to reduce your risk of skin cancer. Safety Always wear your seat belt while driving or riding in a vehicle. Do not drive: If you have been drinking alcohol. Do not ride with someone who has been drinking. If you have been using any mind-altering substances or drugs. While texting. When you are tired or distracted. Wear a helmet and other protective equipment during sports activities. If you have firearms in your house, make sure you follow all gun safety procedures. Seek help if you have been physically or sexually abused. What's next? Go to your health care provider once a year for an annual wellness visit. Ask your health care provider how often you should have your eyes and teeth checked. Stay up to date on all vaccines. This information is not intended to replace advice given to you by your health care provider. Make sure you discuss any questions you have with your health care provider. Document Revised: 03/22/2021 Document Reviewed: 03/22/2021 Elsevier Patient  Education  2024 [employer].

## 2023-08-29 NOTE — Progress Notes (Signed)
Chief Complaint:  Steve Maynard is a 22 y.o. male who presents today for his annual comprehensive physical exam.    Assessment/Plan:  Chronic Problems Addressed Today: ADHD Doing well on current regimen no evidence of new milligrams daily in the morning and Ritalin 10 to 20 mg in the afternoon as needed.  Will refill today.  He is tolerating well without any significant side effects.  Medications help with ability stay focused and on task.  Follow-up in 6 months.  Seasonal allergies Still on Zyrtec 10 mg daily as needed.  Preventative Healthcare: Due for tetanus vaccine next month -he can get this at the pharmacy or we can do this at his next office visit.  Patient Counseling(The following topics were reviewed and/or handout was given):  -Nutrition: Stressed importance of moderation in sodium/caffeine intake, saturated fat and cholesterol, caloric balance, sufficient intake of fresh fruits, vegetables, and fiber.  -Stressed the importance of regular exercise.   -Substance Abuse: Discussed cessation/primary prevention of tobacco, alcohol, or other drug use; driving or other dangerous activities under the influence; availability of treatment for abuse.   -Injury prevention: Discussed safety belts, safety helmets, smoke detector, smoking near bedding or upholstery.   -Sexuality: Discussed sexually transmitted diseases, partner selection, use of condoms, avoidance of unintended pregnancy and contraceptive alternatives.   -Dental health: Discussed importance of regular tooth brushing, flossing, and dental visits.  -Health maintenance and immunizations reviewed. Please refer to Health maintenance section.  Return to care in 1 year for next preventative visit.     Subjective:  HPI:  He has no acute complaints today.   Lifestyle Diet: Balanced. Plenty of fruits and vegetables.  Exercise: Going for walks routinely. Very active at work.      08/29/2023    1:24 PM  Depression  screen PHQ 2/9  Decreased Interest 0  Down, Depressed, Hopeless 0  PHQ - 2 Score 0    Health Maintenance Due  Topic Date Due   Meningococcal B Vaccine  Never done     ROS: Per HPI, otherwise a complete review of systems was negative.   PMH:  The following were reviewed and entered/updated in epic: Past Medical History:  Diagnosis Date   ADHD    Patient Active Problem List   Diagnosis Date Noted   ADHD 07/05/2020   Seasonal allergies 07/05/2020   Acne 07/05/2020   Past Surgical History:  Procedure Laterality Date   CIRCUMCISION      Family History  Adopted: Yes  Problem Relation Age of Onset   Hashimoto's thyroiditis Brother     Medications- reviewed and updated Current Outpatient Medications  Medication Sig Dispense Refill   cetirizine (ZYRTEC) 10 MG tablet Take 10 mg by mouth daily.     lisdexamfetamine (VYVANSE) 70 MG capsule Take 1 capsule (70 mg total) by mouth daily. 30 capsule 0   methylphenidate (RITALIN) 10 MG tablet Take 2 tablets (20 mg total) by mouth daily as needed. Take 1 and 1/2 tablet in the afternoon. 45 tablet 0   No current facility-administered medications for this visit.    Allergies-reviewed and updated No Known Allergies  Social History   Socioeconomic History   Marital status: Single    Spouse name: Not on file   Number of children: Not on file   Years of education: Not on file   Highest education level: Associate degree: occupational, Scientist, product/process development, or vocational program  Occupational History   Not on file  Tobacco Use   Smoking status: Never  Smokeless tobacco: Never  Substance and Sexual Activity   Alcohol use: Never    Alcohol/week: 0.0 standard drinks of alcohol   Drug use: Never   Sexual activity: Not on file  Other Topics Concern   Not on file  Social History Narrative   Lives with parents and brother [name].      7th grade- [employer]   Social Determinants of Health   Financial Resource Strain: Low Risk   (06/16/2023)   Overall Financial Resource Strain (CARDIA)    Difficulty of Paying Living Expenses: Not hard at all  Food Insecurity: No Food Insecurity (06/16/2023)   Hunger Vital Sign    Worried About Running Out of Food in the Last Year: Never true    Ran Out of Food in the Last Year: Never true  Transportation Needs: No Transportation Needs (06/16/2023)   PRAPARE - Administrator, Civil Service (Medical): No    Lack of Transportation (Non-Medical): No  Physical Activity: Sufficiently Active (06/16/2023)   Exercise Vital Sign    Days of Exercise per Week: 5 days    Minutes of Exercise per Session: 60 min  Stress: No Stress Concern Present (06/16/2023)   [employer] of Occupational Health - Occupational Stress Questionnaire    Feeling of Stress : Not at all  Social Connections: Moderately Isolated (06/16/2023)   Social Connection and Isolation Panel [NHANES]    Frequency of Communication with Friends and Family: More than three times a week    Frequency of Social Gatherings with Friends and Family: More than three times a week    Attends Religious Services: 1 to 4 times per year    Active Member of [employer] or Organizations: No    Attends Engineer, structural: Not on file    Marital Status: Never married        Objective:  Physical Exam: BP 122/74   Pulse 98   Temp 98 F (36.7 C) (Temporal)   Ht 5\' 5"  (1.651 m)   Wt 145 lb (65.8 kg)   SpO2 98%   BMI 24.13 kg/m   Body mass index is 24.13 kg/m. Wt Readings from Last 3 Encounters:  08/29/23 145 lb (65.8 kg)  06/20/23 143 lb (64.9 kg)  12/28/22 144 lb 12.8 oz (65.7 kg)   Gen: NAD, resting comfortably HEENT: TMs normal bilaterally. OP clear. No thyromegaly noted.  CV: RRR with no murmurs appreciated Pulm: NWOB, CTAB with no crackles, wheezes, or rhonchi GI: Normal bowel sounds present. Soft, Nontender, Nondistended. MSK: no edema, cyanosis, or clubbing noted Skin: warm, dry Neuro: CN2-12 grossly intact.  Strength 5/5 in upper and lower extremities. Reflexes symmetric and intact bilaterally.  Psych: Normal affect and thought content     Jaina Morin M. Jimmey Ralph, MD 08/29/2023 1:44 PM

## 2023-08-29 NOTE — Assessment & Plan Note (Signed)
Still on Zyrtec 10 mg daily as needed.

## 2023-08-29 NOTE — Assessment & Plan Note (Signed)
Doing well on current regimen no evidence of new milligrams daily in the morning and Ritalin 10 to 20 mg in the afternoon as needed.  Will refill today.  He is tolerating well without any significant side effects.  Medications help with ability stay focused and on task.  Follow-up in 6 months.

## 2023-09-26 ENCOUNTER — Other Ambulatory Visit: Payer: Self-pay | Admitting: Family Medicine

## 2023-09-27 MED ORDER — LISDEXAMFETAMINE DIMESYLATE 70 MG PO CAPS: 70.0000 mg | ORAL_CAPSULE | Freq: Every day | ORAL | 0 refills | Status: DC

## 2023-09-27 MED ORDER — METHYLPHENIDATE HCL 10 MG PO TABS: 20.0000 mg | ORAL_TABLET | Freq: Every day | ORAL | 0 refills | Status: DC | PRN

## 2023-10-24 ENCOUNTER — Other Ambulatory Visit: Payer: Self-pay | Admitting: Family Medicine

## 2023-10-25 MED ORDER — METHYLPHENIDATE HCL 10 MG PO TABS: 20.0000 mg | ORAL_TABLET | Freq: Every day | ORAL | 0 refills | Status: DC | PRN

## 2023-10-25 MED ORDER — LISDEXAMFETAMINE DIMESYLATE 70 MG PO CAPS: 70.0000 mg | ORAL_CAPSULE | Freq: Every day | ORAL | 0 refills | Status: DC

## 2023-10-25 NOTE — Telephone Encounter (Signed)
Vyvanse  08/29/23 LOV  30/0 refills  09/27/23 fill date   Ritalin  08/29/2023 LOV  45/0 refills  09/27/2023 fill date

## 2023-11-26 ENCOUNTER — Other Ambulatory Visit: Payer: Self-pay | Admitting: Family Medicine

## 2023-11-26 MED ORDER — LISDEXAMFETAMINE DIMESYLATE 70 MG PO CAPS: 70.0000 mg | ORAL_CAPSULE | Freq: Every day | ORAL | 0 refills | Status: DC

## 2023-11-26 MED ORDER — METHYLPHENIDATE HCL 10 MG PO TABS: 20.0000 mg | ORAL_TABLET | Freq: Every day | ORAL | 0 refills | Status: DC | PRN

## 2023-11-26 NOTE — Telephone Encounter (Signed)
LAST APPOINTMENT DATE: 08/29/2023   NEXT APPOINTMENT DATE: Visit date not found    LAST REFILL:[id number]  QTY:30

## 2023-12-27 ENCOUNTER — Other Ambulatory Visit: Payer: Self-pay | Admitting: Family Medicine

## 2023-12-27 MED ORDER — LISDEXAMFETAMINE DIMESYLATE 70 MG PO CAPS: 70.0000 mg | ORAL_CAPSULE | Freq: Every day | ORAL | 0 refills | Status: DC

## 2023-12-27 NOTE — Telephone Encounter (Signed)
 Pt requesting refills for Vyvanse 70 mg. Last OV 08/29/2023.

## 2024-01-27 ENCOUNTER — Other Ambulatory Visit: Payer: Self-pay | Admitting: Family Medicine

## 2024-01-28 MED ORDER — LISDEXAMFETAMINE DIMESYLATE 70 MG PO CAPS: 70.0000 mg | ORAL_CAPSULE | Freq: Every day | ORAL | 0 refills | Status: DC

## 2024-02-25 ENCOUNTER — Encounter: Payer: Self-pay | Admitting: Family Medicine

## 2024-02-25 NOTE — Telephone Encounter (Signed)
 Please schedule a F/U appt medication refill

## 2024-02-27 ENCOUNTER — Ambulatory Visit: Admitting: Family Medicine

## 2024-02-27 VITALS — BP 136/77 | HR 78 | Temp 97.3°F | Ht 65.0 in | Wt 152.0 lb

## 2024-02-27 DIAGNOSIS — J302 Other seasonal allergic rhinitis: Secondary | ICD-10-CM | POA: Diagnosis not present

## 2024-02-27 DIAGNOSIS — F909 Attention-deficit hyperactivity disorder, unspecified type: Secondary | ICD-10-CM

## 2024-02-27 MED ORDER — LISDEXAMFETAMINE DIMESYLATE 70 MG PO CAPS: 70.0000 mg | ORAL_CAPSULE | Freq: Every day | ORAL | 0 refills | Status: DC

## 2024-02-27 MED ORDER — METHYLPHENIDATE HCL 10 MG PO TABS: ORAL_TABLET | ORAL | 0 refills | Status: DC

## 2024-02-27 NOTE — Assessment & Plan Note (Signed)
Stable on Zyrtec 10 mg daily as needed seasonally.

## 2024-02-27 NOTE — Assessment & Plan Note (Signed)
 Done well with current regimen Vyvanse  70 mg daily in the morning and Ritalin  10 to 15 mg in the afternoon as needed.  Will refill both of these today.  Doing well without side effects.  Medications help with his ability to stay focused and on task at work.  Database was reviewed without red flags.  Follow-up in 6 months.

## 2024-02-27 NOTE — Patient Instructions (Signed)
 It was very nice to see you today!  We will refill your medications today.  Please let us  know if you have any issues.  I will see you back in 6 months for your annual physical.  Please come back sooner if needed.  Return in about 6 months (around 08/29/2024) for Annual Physical.   Take care, Dr Daneil Dunker  PLEASE NOTE:  If you had any lab tests, please let us  know if you have not heard back within a few days. You may see your results on mychart before we have a chance to review them but we will give you a call once they are reviewed by us .   If we ordered any referrals today, please let us  know if you have not heard from their office within the next week.   If you had any urgent prescriptions sent in today, please check with the pharmacy within an hour of our visit to make sure the prescription was transmitted appropriately.   Please try these tips to maintain a healthy lifestyle:  Eat at least 3 REAL meals and 1-2 snacks per day.  Aim for no more than 5 hours between eating.  If you eat breakfast, please do so within one hour of getting up.   Each meal should contain half fruits/vegetables, one quarter protein, and one quarter carbs (no bigger than a computer mouse)  Cut down on sweet beverages. This includes juice, soda, and sweet tea.   Drink at least 1 glass of water with each meal and aim for at least 8 glasses per day  Exercise at least 150 minutes every week.

## 2024-02-27 NOTE — Progress Notes (Signed)
   Steve Maynard is a 23 y.o. male who presents today for an office visit.  Assessment/Plan:  Chronic Problems Addressed Today: ADHD Done well with current regimen Vyvanse  70 mg daily in the morning and Ritalin  10 to 15 mg in the afternoon as needed.  Will refill both of these today.  Doing well without side effects.  Medications help with his ability to stay focused and on task at work.  Database was reviewed without red flags.  Follow-up in 6 months.  Seasonal allergies Stable on Zyrtec  10 mg daily as needed seasonally.     Subjective:  HPI:  See A/P for status of chronic conditions.  Patient is here today for 37-month follow-up.  Currently on Vyvanse  70 mg daily for ADHD.  Doing well with this.  No significant side effects.  Medications help with his ability stay focused on task at work.  Tolerating well.  Only uses his afternoon ritalin  sporadically as needed.       Objective:  Physical Exam: BP 136/77   Pulse 78   Temp (!) 97.3 F (36.3 C) (Temporal)   Ht 5\' 5"  (1.651 m)   Wt 152 lb (68.9 kg)   SpO2 98%   BMI 25.29 kg/m   Gen: No acute distress, resting comfortably CV: Regular rate and rhythm with no murmurs appreciated Pulm: Normal work of breathing, clear to auscultation bilaterally with no crackles, wheezes, or rhonchi Neuro: Grossly normal, moves all extremities Psych: Normal affect and thought content      Andie Mortimer M. Daneil Dunker, MD 02/27/2024 11:29 AM

## 2024-03-28 ENCOUNTER — Other Ambulatory Visit: Payer: Self-pay | Admitting: Family Medicine

## 2024-04-28 MED ORDER — LISDEXAMFETAMINE DIMESYLATE 70 MG PO CAPS: 70.0000 mg | ORAL_CAPSULE | Freq: Every day | ORAL | 0 refills | Status: DC

## 2024-05-28 ENCOUNTER — Other Ambulatory Visit: Payer: Self-pay | Admitting: Family Medicine

## 2024-06-27 ENCOUNTER — Other Ambulatory Visit: Payer: Self-pay | Admitting: Family Medicine

## 2024-07-28 ENCOUNTER — Other Ambulatory Visit: Payer: Self-pay | Admitting: Family Medicine

## 2024-08-28 ENCOUNTER — Other Ambulatory Visit: Payer: Self-pay | Admitting: Family Medicine

## 2024-09-10 ENCOUNTER — Encounter: Payer: Self-pay | Admitting: Family Medicine

## 2024-09-10 ENCOUNTER — Ambulatory Visit: Admitting: Family Medicine

## 2024-09-10 VITALS — BP 120/60 | HR 86 | Temp 98.6°F | Ht 65.0 in | Wt 162.8 lb

## 2024-09-10 DIAGNOSIS — Z23 Encounter for immunization: Secondary | ICD-10-CM

## 2024-09-10 DIAGNOSIS — Z0001 Encounter for general adult medical examination with abnormal findings: Secondary | ICD-10-CM

## 2024-09-10 DIAGNOSIS — F909 Attention-deficit hyperactivity disorder, unspecified type: Secondary | ICD-10-CM

## 2024-09-10 MED ORDER — METHYLPHENIDATE HCL 10 MG PO TABS: ORAL_TABLET | ORAL | 0 refills | Status: DC

## 2024-09-10 NOTE — Assessment & Plan Note (Signed)
 Stable on Vyvanse  70 mg daily in the morning and Ritalin  1050 mg in the afternoon as needed.  Will refill his Ritalin  today.  Tolerating these well without any significant side effects.  Medications do help with his ability to stay focused and on task.  Follow-up in 6 months.

## 2024-09-10 NOTE — Progress Notes (Signed)
 Chief Complaint:  Steve Maynard is a 23 y.o. male who presents today for his annual comprehensive physical exam.    Assessment/Plan:   Chronic Problems Addressed Today: ADHD Stable on Vyvanse  70 mg daily in the morning and Ritalin  1050 mg in the afternoon as needed.  Will refill his Ritalin  today.  Tolerating these well without any significant side effects.  Medications do help with his ability to stay focused and on task.  Follow-up in 6 months.   Preventative Healthcare: Flu vaccine given today. Deferred labs.   Patient Counseling(The following topics were reviewed and/or handout was given):  -Nutrition: Stressed importance of moderation in sodium/caffeine intake, saturated fat and cholesterol, caloric balance, sufficient intake of fresh fruits, vegetables, and fiber.  -Stressed the importance of regular exercise.   -Substance Abuse: Discussed cessation/primary prevention of tobacco, alcohol, or other drug use; driving or other dangerous activities under the influence; availability of treatment for abuse.   -Injury prevention: Discussed safety belts, safety helmets, smoke detector, smoking near bedding or upholstery.   -Sexuality: Discussed sexually transmitted diseases, partner selection, use of condoms, avoidance of unintended pregnancy and contraceptive alternatives.   -Dental health: Discussed importance of regular tooth brushing, flossing, and dental visits.  -Health maintenance and immunizations reviewed. Please refer to Health maintenance section.  Return to care in 1 year for next preventative visit.     Subjective:  HPI:  He has no acute complaints today. Patient is here today for his annual physical.  See assessment / plan for status of chronic conditions.  Discussed the use of AI scribe software for clinical note transcription with the patient, who gave verbal consent to proceed.  History of Present Illness Steve Maynard is a 23 year old male who  presents for an annual physical exam.  He has no new concerns since his last visit six or seven months ago. He is currently taking Vyvanse  without side effects and is satisfied with the current dose, which he feels helps him accomplish daily tasks. He also takes [employer] brand vitamins and reports doing well with them.  He received his flu shot recently. He is actively engaged in physical activity through his work, which involves being outside and performing labor-intensive tasks. He is also trying to maintain a healthy diet, focusing on consuming plenty of fruits and vegetables.      09/10/2024   11:09 AM  Depression screen PHQ 2/9  Decreased Interest 0  Down, Depressed, Hopeless 0  PHQ - 2 Score 0    There are no preventive care reminders to display for this patient.   ROS: Per HPI, otherwise a complete review of systems was negative.   PMH:  The following were reviewed and entered/updated in epic: Past Medical History:  Diagnosis Date   ADHD    Patient Active Problem List   Diagnosis Date Noted   ADHD 07/05/2020   Seasonal allergies 07/05/2020   Acne 07/05/2020   Past Surgical History:  Procedure Laterality Date   CIRCUMCISION      Family History  Adopted: Yes  Problem Relation Age of Onset   Hashimoto's thyroiditis Brother     Medications- reviewed and updated Current Outpatient Medications  Medication Sig Dispense Refill   cetirizine  (ZYRTEC ) 10 MG tablet Take 10 mg by mouth daily.     lisdexamfetamine (VYVANSE ) 70 MG capsule Take 1 capsule (70 mg total) by mouth daily. 30 capsule 0   methylphenidate  (RITALIN ) 10 MG tablet Take 1&1/2 tablets in the afternoon daily  as needed 45 tablet 0   No current facility-administered medications for this visit.    Allergies-reviewed and updated No Known Allergies  Social History   Socioeconomic History   Marital status: Single    Spouse name: Not on file   Number of children: Not on file   Years of education: Not on  file   Highest education level: Associate degree: occupational, scientist, product/process development, or vocational program  Occupational History   Not on file  Tobacco Use   Smoking status: Never   Smokeless tobacco: Never  Substance and Sexual Activity   Alcohol use: Never    Alcohol/week: 0.0 standard drinks of alcohol   Drug use: Never   Sexual activity: Not on file  Other Topics Concern   Not on file  Social History Narrative   Lives with parents and brother Karleen.      7th grade- [employer]   Social Drivers of Corporate Investment Banker Strain: Low Risk  (09/08/2024)   Overall Financial Resource Strain (CARDIA)    Difficulty of Paying Living Expenses: Not hard at all  Food Insecurity: No Food Insecurity (09/08/2024)   Hunger Vital Sign    Worried About Running Out of Food in the Last Year: Never true    Ran Out of Food in the Last Year: Never true  Transportation Needs: No Transportation Needs (09/08/2024)   PRAPARE - Administrator, Civil Service (Medical): No    Lack of Transportation (Non-Medical): No  Physical Activity: Sufficiently Active (09/08/2024)   Exercise Vital Sign    Days of Exercise per Week: 5 days    Minutes of Exercise per Session: 30 min  Stress: No Stress Concern Present (09/08/2024)   [employer] of Occupational Health - Occupational Stress Questionnaire    Feeling of Stress: Not at all  Social Connections: Moderately Isolated (09/08/2024)   Social Connection and Isolation Panel    Frequency of Communication with Friends and Family: More than three times a week    Frequency of Social Gatherings with Friends and Family: More than three times a week    Attends Religious Services: 1 to 4 times per year    Active Member of [employer] or Organizations: No    Attends Engineer, Structural: Not on file    Marital Status: Never married        Objective:  Physical Exam: BP 120/60   Pulse 86   Temp 98.6 F (37 C) (Temporal)   Ht 5' 5 (1.651 m)    Wt 162 lb 12.8 oz (73.8 kg)   SpO2 98%   BMI 27.09 kg/m   Body mass index is 27.09 kg/m. Wt Readings from Last 3 Encounters:  09/10/24 162 lb 12.8 oz (73.8 kg)  02/27/24 152 lb (68.9 kg)  08/29/23 145 lb (65.8 kg)   Gen: NAD, resting comfortably HEENT: TMs normal bilaterally. OP clear. No thyromegaly noted.  CV: RRR with no murmurs appreciated Pulm: NWOB, CTAB with no crackles, wheezes, or rhonchi GI: Normal bowel sounds present. Soft, Nontender, Nondistended. MSK: no edema, cyanosis, or clubbing noted Skin: warm, dry Neuro: CN2-12 grossly intact. Strength 5/5 in upper and lower extremities. Reflexes symmetric and intact bilaterally.  Psych: Normal affect and thought content     Steve Maynard M. Kennyth, MD 09/10/2024 11:32 AM

## 2024-09-10 NOTE — Patient Instructions (Addendum)
 It was very nice to see you today!  VISIT SUMMARY: Today, you had your annual physical exam. You reported no new concerns and are doing well with your current medications and vitamins. Your blood pressure is normal, and you are maintaining a healthy diet and regular physical activity.  YOUR PLAN: ADULT WELLNESS VISIT: Routine visit with no new concerns. Blood pressure is normal. You engage in regular physical activity and maintain a healthy diet. -Continue your current diet and exercise regimen. -You declined the tetanus vaccination and prefer to wait for blood work. -Schedule a follow-up visit in six months.  ATTENTION DEFICIT HYPERACTIVITY DISORDER (ADHD): Your ADHD is well-managed with your current medication regimen. No side effects reported. -Continue taking Vyvanse  70 mg daily. -Your Ritalin  prescription for afternoon use has been refilled. -Schedule a follow-up visit in six months.  Return in about 6 months (around 03/11/2025) for Follow Up.   Take care, Dr Kennyth  PLEASE NOTE:  If you had any lab tests, please let us  know if you have not heard back within a few days. You may see your results on mychart before we have a chance to review them but we will give you a call once they are reviewed by us .   If we ordered any referrals today, please let us  know if you have not heard from their office within the next week.   If you had any urgent prescriptions sent in today, please check with the pharmacy within an hour of our visit to make sure the prescription was transmitted appropriately.   Please try these tips to maintain a healthy lifestyle:  Eat at least 3 REAL meals and 1-2 snacks per day.  Aim for no more than 5 hours between eating.  If you eat breakfast, please do so within one hour of getting up.   Each meal should contain half fruits/vegetables, one quarter protein, and one quarter carbs (no bigger than a computer mouse)  Cut down on sweet beverages. This includes juice,  soda, and sweet tea.   Drink at least 1 glass of water with each meal and aim for at least 8 glasses per day  Exercise at least 150 minutes every week.

## 2024-09-26 ENCOUNTER — Other Ambulatory Visit: Payer: Self-pay | Admitting: Family Medicine

## 2024-10-30 ENCOUNTER — Other Ambulatory Visit: Payer: Self-pay | Admitting: Family Medicine

## 2025-03-11 ENCOUNTER — Ambulatory Visit: Admitting: Family Medicine

## 2025-09-16 ENCOUNTER — Encounter: Admitting: Family Medicine
